# Patient Record
Sex: Male | Born: 1935 | Race: White | Hispanic: No | Marital: Married | Smoking: Never smoker
Health system: Southern US, Community
[De-identification: ages and names within clinical notes are randomized; demographics above are authoritative.]

## PROBLEM LIST (undated history)

## (undated) DIAGNOSIS — C259 Malignant neoplasm of pancreas, unspecified: Secondary | ICD-10-CM

## (undated) DIAGNOSIS — H3530 Unspecified macular degeneration: Secondary | ICD-10-CM

## (undated) DIAGNOSIS — K8689 Other specified diseases of pancreas: Secondary | ICD-10-CM

## (undated) DIAGNOSIS — H409 Unspecified glaucoma: Secondary | ICD-10-CM

## (undated) DIAGNOSIS — E7800 Pure hypercholesterolemia, unspecified: Secondary | ICD-10-CM

## (undated) DIAGNOSIS — I10 Essential (primary) hypertension: Secondary | ICD-10-CM

## (undated) HISTORY — DX: Malignant neoplasm of pancreas, unspecified: C25.9

---

## 1999-03-13 ENCOUNTER — Emergency Department (HOSPITAL_COMMUNITY): Admission: EM | Admit: 1999-03-13 | Discharge: 1999-03-13 | Payer: Self-pay | Admitting: Emergency Medicine

## 2003-12-11 ENCOUNTER — Ambulatory Visit (HOSPITAL_COMMUNITY): Admission: RE | Admit: 2003-12-11 | Discharge: 2003-12-11 | Payer: Self-pay | Admitting: Ophthalmology

## 2006-02-24 ENCOUNTER — Encounter: Payer: Self-pay | Admitting: Emergency Medicine

## 2012-07-06 ENCOUNTER — Other Ambulatory Visit: Payer: Self-pay | Admitting: Family Medicine

## 2012-07-06 DIAGNOSIS — M5104 Intervertebral disc disorders with myelopathy, thoracic region: Secondary | ICD-10-CM

## 2012-07-11 ENCOUNTER — Other Ambulatory Visit: Payer: Self-pay

## 2012-07-12 ENCOUNTER — Ambulatory Visit
Admission: RE | Admit: 2012-07-12 | Discharge: 2012-07-12 | Disposition: A | Discharge status: Home / Self Care | Payer: Medicare Other | Source: Ambulatory Visit | Attending: Family Medicine | Admitting: Family Medicine

## 2012-07-12 VITALS — BP 165/73 | HR 58 | Ht 69.0 in | Wt 162.0 lb

## 2012-07-12 DIAGNOSIS — M5104 Intervertebral disc disorders with myelopathy, thoracic region: Secondary | ICD-10-CM

## 2012-07-12 DIAGNOSIS — M5125 Other intervertebral disc displacement, thoracolumbar region: Secondary | ICD-10-CM

## 2012-07-12 MED ORDER — IOHEXOL 180 MG/ML  SOLN
1.0000 mL | Freq: Once | INTRAMUSCULAR | Status: AC | PRN
  Administered 2012-07-12: 1 mL via EPIDURAL

## 2012-07-12 MED ORDER — METHYLPREDNISOLONE ACETATE 40 MG/ML INJ SUSP (RADIOLOG
120.0000 mg | Freq: Once | INTRAMUSCULAR | Status: AC
  Administered 2012-07-12: 120 mg via EPIDURAL

## 2013-03-09 ENCOUNTER — Ambulatory Visit
Admission: RE | Admit: 2013-03-09 | Discharge: 2013-03-09 | Disposition: A | Discharge status: Home / Self Care | Payer: Medicare Other | Source: Ambulatory Visit | Attending: Rheumatology | Admitting: Rheumatology

## 2013-03-09 ENCOUNTER — Other Ambulatory Visit: Payer: Self-pay | Admitting: Rheumatology

## 2013-03-09 DIAGNOSIS — M542 Cervicalgia: Secondary | ICD-10-CM

## 2013-11-23 ENCOUNTER — Other Ambulatory Visit: Payer: Self-pay | Admitting: Internal Medicine

## 2013-11-23 DIAGNOSIS — R109 Unspecified abdominal pain: Secondary | ICD-10-CM

## 2013-11-24 ENCOUNTER — Encounter (HOSPITAL_COMMUNITY): Payer: Self-pay | Admitting: Emergency Medicine

## 2013-11-24 ENCOUNTER — Inpatient Hospital Stay (HOSPITAL_COMMUNITY)
Admission: EM | Admit: 2013-11-24 | Discharge: 2013-11-30 | DRG: 374 | Disposition: A | Discharge status: Home / Self Care | Payer: Medicare HMO | Attending: Internal Medicine | Admitting: Internal Medicine

## 2013-11-24 ENCOUNTER — Inpatient Hospital Stay (HOSPITAL_COMMUNITY): Payer: Medicare HMO

## 2013-11-24 DIAGNOSIS — C169 Malignant neoplasm of stomach, unspecified: Secondary | ICD-10-CM

## 2013-11-24 DIAGNOSIS — I10 Essential (primary) hypertension: Secondary | ICD-10-CM | POA: Diagnosis present

## 2013-11-24 DIAGNOSIS — K869 Disease of pancreas, unspecified: Secondary | ICD-10-CM | POA: Diagnosis present

## 2013-11-24 DIAGNOSIS — E785 Hyperlipidemia, unspecified: Secondary | ICD-10-CM | POA: Diagnosis present

## 2013-11-24 DIAGNOSIS — R7309 Other abnormal glucose: Secondary | ICD-10-CM | POA: Diagnosis present

## 2013-11-24 DIAGNOSIS — R339 Retention of urine, unspecified: Secondary | ICD-10-CM | POA: Diagnosis present

## 2013-11-24 DIAGNOSIS — H3530 Unspecified macular degeneration: Secondary | ICD-10-CM | POA: Diagnosis present

## 2013-11-24 DIAGNOSIS — K831 Obstruction of bile duct: Secondary | ICD-10-CM | POA: Diagnosis present

## 2013-11-24 DIAGNOSIS — R17 Unspecified jaundice: Secondary | ICD-10-CM

## 2013-11-24 DIAGNOSIS — K8689 Other specified diseases of pancreas: Secondary | ICD-10-CM | POA: Diagnosis present

## 2013-11-24 DIAGNOSIS — D649 Anemia, unspecified: Secondary | ICD-10-CM | POA: Diagnosis present

## 2013-11-24 DIAGNOSIS — E871 Hypo-osmolality and hyponatremia: Secondary | ICD-10-CM | POA: Diagnosis present

## 2013-11-24 DIAGNOSIS — H409 Unspecified glaucoma: Secondary | ICD-10-CM | POA: Diagnosis present

## 2013-11-24 DIAGNOSIS — K3189 Other diseases of stomach and duodenum: Secondary | ICD-10-CM

## 2013-11-24 DIAGNOSIS — E876 Hypokalemia: Secondary | ICD-10-CM | POA: Diagnosis not present

## 2013-11-24 DIAGNOSIS — E7800 Pure hypercholesterolemia, unspecified: Secondary | ICD-10-CM | POA: Diagnosis present

## 2013-11-24 DIAGNOSIS — L299 Pruritus, unspecified: Secondary | ICD-10-CM | POA: Diagnosis present

## 2013-11-24 DIAGNOSIS — I4891 Unspecified atrial fibrillation: Secondary | ICD-10-CM | POA: Diagnosis present

## 2013-11-24 HISTORY — DX: Pure hypercholesterolemia, unspecified: E78.00

## 2013-11-24 HISTORY — DX: Unspecified macular degeneration: H35.30

## 2013-11-24 HISTORY — DX: Unspecified glaucoma: H40.9

## 2013-11-24 HISTORY — DX: Essential (primary) hypertension: I10

## 2013-11-24 HISTORY — DX: Other specified diseases of pancreas: K86.89

## 2013-11-24 LAB — CBC WITH DIFFERENTIAL/PLATELET
BASOS PCT: 0 % (ref 0–1)
Basophils Absolute: 0 10*3/uL (ref 0.0–0.1)
Eosinophils Absolute: 0.1 10*3/uL (ref 0.0–0.7)
Eosinophils Relative: 2 % (ref 0–5)
HEMATOCRIT: 34 % — AB (ref 39.0–52.0)
HEMOGLOBIN: 12.5 g/dL — AB (ref 13.0–17.0)
LYMPHS ABS: 1.6 10*3/uL (ref 0.7–4.0)
Lymphocytes Relative: 19 % (ref 12–46)
MCH: 32.5 pg (ref 26.0–34.0)
MCHC: 36.8 g/dL — AB (ref 30.0–36.0)
MCV: 88.3 fL (ref 78.0–100.0)
MONO ABS: 1.3 10*3/uL — AB (ref 0.1–1.0)
MONOS PCT: 16 % — AB (ref 3–12)
NEUTROS ABS: 5.2 10*3/uL (ref 1.7–7.7)
Neutrophils Relative %: 63 % (ref 43–77)
Platelets: 299 10*3/uL (ref 150–400)
RBC: 3.85 MIL/uL — AB (ref 4.22–5.81)
RDW: 14.9 % (ref 11.5–15.5)
WBC: 8.2 10*3/uL (ref 4.0–10.5)

## 2013-11-24 LAB — COMPREHENSIVE METABOLIC PANEL
ALK PHOS: 858 U/L — AB (ref 39–117)
ALT: 424 U/L — ABNORMAL HIGH (ref 0–53)
AST: 288 U/L — ABNORMAL HIGH (ref 0–37)
Albumin: 3.5 g/dL (ref 3.5–5.2)
BILIRUBIN TOTAL: 11.1 mg/dL — AB (ref 0.3–1.2)
BUN: 18 mg/dL (ref 6–23)
CHLORIDE: 90 meq/L — AB (ref 96–112)
CO2: 23 meq/L (ref 19–32)
CREATININE: 0.64 mg/dL (ref 0.50–1.35)
Calcium: 9.1 mg/dL (ref 8.4–10.5)
GFR, EST NON AFRICAN AMERICAN: 85 mL/min — AB (ref >90)
GLUCOSE: 127 mg/dL — AB (ref 70–99)
Potassium: 4.6 mEq/L (ref 3.7–5.3)
Sodium: 126 mEq/L — ABNORMAL LOW (ref 137–147)
Total Protein: 7.1 g/dL (ref 6.0–8.3)

## 2013-11-24 LAB — LIPASE, BLOOD: LIPASE: 500 U/L — AB (ref 11–59)

## 2013-11-24 LAB — PROTIME-INR
INR: 0.99 (ref 0.00–1.49)
Prothrombin Time: 12.9 seconds (ref 11.6–15.2)

## 2013-11-24 LAB — AMMONIA: AMMONIA: 55 umol/L (ref 11–60)

## 2013-11-24 MED ORDER — IOHEXOL 300 MG/ML  SOLN
100.0000 mL | Freq: Once | INTRAMUSCULAR | Status: AC | PRN
  Administered 2013-11-24: 100 mL via INTRAVENOUS

## 2013-11-24 MED ORDER — SODIUM CHLORIDE 0.9 % IV SOLN
1000.0000 mL | INTRAVENOUS | Status: DC
  Administered 2013-11-24 – 2013-11-26 (×6): 1000 mL via INTRAVENOUS

## 2013-11-24 MED ORDER — SODIUM CHLORIDE 0.9 % IV SOLN
1000.0000 mL | Freq: Once | INTRAVENOUS | Status: AC
  Administered 2013-11-24: 1000 mL via INTRAVENOUS

## 2013-11-24 MED ORDER — ONDANSETRON HCL 4 MG/2ML IJ SOLN: 4.0000 mg | Freq: Three times a day (TID) | INTRAMUSCULAR | Status: AC | PRN

## 2013-11-24 MED ORDER — SODIUM CHLORIDE 0.9 % IV SOLN
INTRAVENOUS | Status: AC
  Administered 2013-11-25: via INTRAVENOUS

## 2013-11-24 MED ORDER — IOHEXOL 300 MG/ML  SOLN
25.0000 mL | INTRAMUSCULAR | Status: AC
  Administered 2013-11-24: 25 mL via ORAL

## 2013-11-24 NOTE — ED Notes (Addendum)
Pt here with referral from Dr. Benson Norway. Reports that they were concerned about his liver and had an MRI/CT done today. States that they were also concerned about his abd, possible mass on the CT. Pt has all of his imaging paperwork with him.

## 2013-11-24 NOTE — ED Provider Notes (Signed)
CSN: [id number]     Arrival date & time 11/24/13  1803 History   First MD Initiated Contact with Patient 11/24/13 1812     Chief Complaint  Patient presents with  . Abnormal Lab    HPI Patient presents with painless jaundice and itching over the past several weeks.  He's been worked up as an outpatient I've primary care physician and was found to have a bilirubin of 10, two days ago.  He had outpatient ultrasound or noncontrasted CT scan of his abdomen and pelvis which demonstrated biliary obstruction.  He denies nausea and vomiting.  No anorexia.  He reports his main complaint is itching.  Patient was recommended to come to the emergency department for possible admission and GI procedure.   Past Medical History  Diagnosis Date  . Hypertension   . Hypercholesteremia    History reviewed. No pertinent past surgical history. History reviewed. No pertinent family history. History  Substance Use Topics  . Smoking status: Never Smoker   . Smokeless tobacco: Never Used  . Alcohol Use: No    Review of Systems  All other systems reviewed and are negative.    Allergies  Review of patient's allergies indicates no known allergies.  Home Medications   Current Outpatient Rx  Name  Route  Sig  Dispense  Refill  . aspirin 81 MG tablet   Oral   Take 81 mg by mouth daily.         . benazepril (LOTENSIN) 10 MG tablet   Oral   Take 10 mg by mouth daily.         . brimonidine (ALPHAGAN P) 0.1 % SOLN   Left Eye   Place 1 drop into the left eye daily.         . calcium citrate-vitamin D (CITRACAL+D) 315-200 MG-UNIT per tablet   Oral   Take 1 tablet by mouth daily.         . Cholecalciferol (VITAMIN D-3) 1000 UNITS CAPS   Oral   Take 1 capsule by mouth daily.         . diazepam (VALIUM) 5 MG tablet   Oral   Take 5 mg by mouth 2 (two) times daily as needed for anxiety. For sleep per daughter         . dorzolamide (TRUSOPT) 2 % ophthalmic solution   Both Eyes   Place  1 drop into both eyes 2 (two) times daily.         . hydrochlorothiazide (HYDRODIURIL) 25 MG tablet   Oral   Take 25 mg by mouth daily.         . Multiple Vitamin (MULTIVITAMIN WITH MINERALS) TABS tablet   Oral   Take 1 tablet by mouth daily.         . Omega-3 Fatty Acids (OMEGA 3 PO)   Oral   Take 1 capsule by mouth at bedtime.         Marland Kitchen OVER THE COUNTER MEDICATION   Oral   Take 1 tablet by mouth 2 (two) times daily. Macular Degeneration supplement         . simvastatin (ZOCOR) 80 MG tablet   Oral   Take 80 mg by mouth daily.          BP 140/70  Pulse 89  Temp(Src) 98 F (36.7 C) (Oral)  Resp 20  Wt 161 lb 12.8 oz (73.392 kg)  SpO2 98% Physical Exam  Nursing note and vitals reviewed. Constitutional: He  is oriented to person, place, and time. He appears well-developed and well-nourished.  Jaundice   HENT:  Head: Normocephalic and atraumatic.  Eyes: EOM are normal.  Neck: Normal range of motion.  Cardiovascular: Normal rate, regular rhythm, normal heart sounds and intact distal pulses.   Pulmonary/Chest: Effort normal and breath sounds normal. No respiratory distress.  Abdominal: Soft. He exhibits no distension. There is no tenderness.  Musculoskeletal: Normal range of motion.  Neurological: He is alert and oriented to person, place, and time.  Skin: Skin is warm and dry.  Psychiatric: He has a normal mood and affect. Judgment normal.    ED Course  Procedures (including critical care time) Labs Review Labs Reviewed  CBC WITH DIFFERENTIAL - Abnormal; Notable for the following:    RBC 3.85 (*)    Hemoglobin 12.5 (*)    HCT 34.0 (*)    MCHC 36.8 (*)    Monocytes Relative 16 (*)    Monocytes Absolute 1.3 (*)    All other components within normal limits  COMPREHENSIVE METABOLIC PANEL - Abnormal; Notable for the following:    Sodium 126 (*)    Chloride 90 (*)    Glucose, Bld 127 (*)    AST 288 (*)    ALT 424 (*)    Alkaline Phosphatase 858 (*)     Total Bilirubin 11.1 (*)    GFR calc non Af Amer 85 (*)    All other components within normal limits  LIPASE, BLOOD - Abnormal; Notable for the following:    Lipase 500 (*)    All other components within normal limits  AMMONIA  PROTIME-INR   Imaging Review No results found.  EKG Interpretation   None       MDM   1. Jaundice    Case was discussed with gastroenterologist Dr. Benson Norway who agrees to admit the patient.  N.p.o. after midnight.  Bilirubin appears to be worsening at 11 today.  Elevated LFTs and lipase as well as alkaline phosphatase consistent with obstructive pathology.  Overall very well appearing. Dr Benson Norway request a contrasted CT scan of his abdomen and pelvis    Hoy Morn, MD 11/24/13 2141

## 2013-11-24 NOTE — ED Notes (Signed)
Pt states he has been itchy. Pt family states that pt's liver levels are high and MRI showed something in the stomach. Pt had ultrasound and MRI (discs are with patient). Pt denies pain. Pt has slight yellowish tinge of skin.

## 2013-11-25 ENCOUNTER — Encounter (HOSPITAL_COMMUNITY): Payer: Self-pay

## 2013-11-25 LAB — COMPREHENSIVE METABOLIC PANEL
ALBUMIN: 3 g/dL — AB (ref 3.5–5.2)
ALK PHOS: 787 U/L — AB (ref 39–117)
ALT: 367 U/L — ABNORMAL HIGH (ref 0–53)
AST: 235 U/L — AB (ref 0–37)
BUN: 17 mg/dL (ref 6–23)
CO2: 19 mEq/L (ref 19–32)
Calcium: 8.6 mg/dL (ref 8.4–10.5)
Chloride: 94 mEq/L — ABNORMAL LOW (ref 96–112)
Creatinine, Ser: 0.55 mg/dL (ref 0.50–1.35)
GFR calc Af Amer: >90 mL/min (ref >90)
GFR calc non Af Amer: >90 mL/min (ref >90)
Glucose, Bld: 138 mg/dL — ABNORMAL HIGH (ref 70–99)
Potassium: 3.9 mEq/L (ref 3.7–5.3)
Sodium: 128 mEq/L — ABNORMAL LOW (ref 137–147)
Total Bilirubin: 11.9 mg/dL — ABNORMAL HIGH (ref 0.3–1.2)
Total Protein: 6.5 g/dL (ref 6.0–8.3)

## 2013-11-25 LAB — CBC
HCT: 31.4 % — ABNORMAL LOW (ref 39.0–52.0)
HEMOGLOBIN: 11.6 g/dL — AB (ref 13.0–17.0)
MCH: 32.5 pg (ref 26.0–34.0)
MCHC: 36.9 g/dL — ABNORMAL HIGH (ref 30.0–36.0)
MCV: 88 fL (ref 78.0–100.0)
Platelets: 269 10*3/uL (ref 150–400)
RBC: 3.57 MIL/uL — ABNORMAL LOW (ref 4.22–5.81)
RDW: 15.1 % (ref 11.5–15.5)
WBC: 8.3 10*3/uL (ref 4.0–10.5)

## 2013-11-25 MED ORDER — CIPROFLOXACIN IN D5W 400 MG/200ML IV SOLN
400.0000 mg | Freq: Two times a day (BID) | INTRAVENOUS | Status: DC
  Administered 2013-11-25 – 2013-11-27 (×5): 400 mg via INTRAVENOUS
  Filled 2013-11-25 (×6): qty 200

## 2013-11-25 NOTE — H&P (Signed)
H&P for [facility]  Reason for Consult: Obstructive jaundice, Pancreatic head mass, Gastric fundus mass, Pruritis Referring Physician: Crist Infante, M.D.  Eliezer Bottom HPI: This is an 80 year old male admitted for obstructive jaundice and pruritis.  His symptoms started 2 weeks ago acutely and since that time his pruritis progressively worsened.  At times his itching can be so severe that he wants to end his life.  No reports of abdominal pain, nausea, vomiting, diarrhea, hematochezia, or melena.  His work up as an outpatient revealed a marked elevation in his TB at 84.  A CT scan was performed, but it was without contrast.  IV access was not able to be obtained during the outpatient scan, however, a mass in the gastric fundus was identified.  Intrahepatic and extrahepatic biliary ductal dilation was also identified.  The admission CT scan revealed a 12 cm x 13 cm lesion in the head of the pancreas that results in an abrupt cut off sign in the CBD.  The fundic lesion measured 4 cm.  Past Medical History  Diagnosis Date  . Hypertension   . Hypercholesteremia     History reviewed. No pertinent past surgical history.  History reviewed. No pertinent family history.  Social History:  reports that he has never smoked. He has never used smokeless tobacco. He reports that he drinks about 4.2 ounces of alcohol per week. He reports that he does not use illicit drugs.  Allergies: No Known Allergies  Medications:  Scheduled: . sodium chloride   Intravenous STAT  . ciprofloxacin  400 mg Intravenous Q12H   Continuous: . sodium chloride 1,000 mL (11/25/13 0823)    Results for orders placed during the hospital encounter of 11/24/13 (from the past 24 hour(s))  CBC WITH DIFFERENTIAL     Status: Abnormal   Collection Time    11/24/13  7:41 PM      Result Value Range   WBC 8.2  4.0 - 10.5 K/uL   RBC 3.85 (*) 4.22 - 5.81 MIL/uL   Hemoglobin 12.5 (*) 13.0 - 17.0 g/dL   HCT 34.0 (*) 39.0 - 52.0 %    MCV 88.3  78.0 - 100.0 fL   MCH 32.5  26.0 - 34.0 pg   MCHC 36.8 (*) 30.0 - 36.0 g/dL   RDW 14.9  11.5 - 15.5 %   Platelets 299  150 - 400 K/uL   Neutrophils Relative % 63  43 - 77 %   Neutro Abs 5.2  1.7 - 7.7 K/uL   Lymphocytes Relative 19  12 - 46 %   Lymphs Abs 1.6  0.7 - 4.0 K/uL   Monocytes Relative 16 (*) 3 - 12 %   Monocytes Absolute 1.3 (*) 0.1 - 1.0 K/uL   Eosinophils Relative 2  0 - 5 %   Eosinophils Absolute 0.1  0.0 - 0.7 K/uL   Basophils Relative 0  0 - 1 %   Basophils Absolute 0.0  0.0 - 0.1 K/uL  COMPREHENSIVE METABOLIC PANEL     Status: Abnormal   Collection Time    11/24/13  7:41 PM      Result Value Range   Sodium 126 (*) 137 - 147 mEq/L   Potassium 4.6  3.7 - 5.3 mEq/L   Chloride 90 (*) 96 - 112 mEq/L   CO2 23  19 - 32 mEq/L   Glucose, Bld 127 (*) 70 - 99 mg/dL   BUN 18  6 - 23 mg/dL   Creatinine,  Ser 0.64  0.50 - 1.35 mg/dL   Calcium 9.1  8.4 - 10.5 mg/dL   Total Protein 7.1  6.0 - 8.3 g/dL   Albumin 3.5  3.5 - 5.2 g/dL   AST 288 (*) 0 - 37 U/L   ALT 424 (*) 0 - 53 U/L   Alkaline Phosphatase 858 (*) 39 - 117 U/L   Total Bilirubin 11.1 (*) 0.3 - 1.2 mg/dL   GFR calc non Af Amer 85 (*) >90 mL/min   GFR calc Af Amer >90  >90 mL/min  LIPASE, BLOOD     Status: Abnormal   Collection Time    11/24/13  7:41 PM      Result Value Range   Lipase 500 (*) 11 - 59 U/L  PROTIME-INR     Status: None   Collection Time    11/24/13  7:41 PM      Result Value Range   Prothrombin Time 12.9  11.6 - 15.2 seconds   INR 0.99  0.00 - 1.49  AMMONIA     Status: None   Collection Time    11/24/13  8:30 PM      Result Value Range   Ammonia 55  11 - 60 umol/L     Ct Abdomen Pelvis W Contrast  11/24/2013   CLINICAL DATA:  Elevated bilirubin and lipase.  Jaundice  EXAM: CT ABDOMEN AND PELVIS WITH CONTRAST  TECHNIQUE: Multidetector CT imaging of the abdomen and pelvis was performed using the standard protocol following bolus administration of intravenous contrast.   CONTRAST:  129mL OMNIPAQUE IOHEXOL 300 MG/ML  SOLN  COMPARISON:  None.  FINDINGS: Lung bases are clear.  No pericardial fluid.  There is intrahepatic and extrahepatic biliary duct dilatation. The common bile duct measures 16 mm up to the pancreatic head. There is a abrupt transition at the pancreatic head. There is mild dilatation of the pancreatic duct to 5 mm. There is a potential relative hypo attenuating lesion within the head of the pancreas measuring 13 by 12 mm (image 40, series 2). The gallbladder is distended up to 4.9 cm. No inflammation evident. No focal hepatic lesion. The pancreas itself is atrophic.  The spleen, adrenal glands, kidneys demonstrate no acute findings. There parapelvic cysts in the kidneys.  The stomach contains a rounded masslike lesion in the fundus measuring 4.0 x 3.2 cm (image 26, series 2). This appears to emanate from the gastric mucosa wiht central calcifications. The more distal stomach is normal. The small bowel colon are unremarkable.  Abdominal aorta is normal caliber. No retroperitoneal periportal lymphadenopathy.  No free fluid the pelvis. The prostate gland is enlarged. Bladder is normal. No pelvic lymphadenopathy. Insert negative then  IMPRESSION: 1. Intrahepatic and extrahepatic biliary duct dilatation with marked dilatation of the common bile duct up to the pancreatic head. Potential mass lesion within the pancreatic head. Recommend a ERCP/EUS for relief of the biliary obstruction and sampling of the potential pancreatic head lesion. 2. Mild dilatation of the pancreatic duct and associated pancreatic atrophy would also be consistent with a pancreatic head mass lesion. Recommend correlation with tumor marker CA 19 9. 3. There is a is potential polypoid 3 cm lesion extending from the gastric mucosa of the stomach fundus. This could represent a primary gastric neoplasm. Recommend optical evaluation at the same time as the above recommended ERCP/EUS. 4. Prostate hypertrophy.    Electronically Signed   By: Suzy Bouchard M.D.   On: 11/24/2013 23:52    ROS:  As stated above in the HPI otherwise negative.  Blood pressure 117/56, pulse 66, temperature 97.5 F (36.4 C), temperature source Oral, resp. rate 18, height 5\' 8"  (1.727 m), weight 161 lb 12.8 oz (73.392 kg), SpO2 100.00%.    PE: Gen: NAD, Alert and Oriented, Jaundiced HEENT:  [place]/AT, EOMI Neck: Supple, no LAD Lungs: CTA Bilaterally CV: RRR without M/G/R ABM: Soft, NTND, +BS Ext: No C/C/E  Assessment/Plan: 1) Pancreatic head mass. 2) Obstructive jaundice. 3) Pruritis. 4) Gastric mass. 5) Hyponatremia.   His pruritis at this time is tolerable, but it does become intolerable at times.  No evidence of cholangitis.  I will pursue further treatment with an ERCP to decompress his bile ducts.  Subsequently he will require an EUS with Dr. Ardis Hughs for pathologic diagnosis.  Plan: 1) ERCP tomorrow. 2) Start Cipro to prophylax against an ascending cholangitis. 3) Check Ca19-9.  Derric Dealmeida D 11/25/2013, 9:10 AM

## 2013-11-25 NOTE — Progress Notes (Signed)
Patient stated he had not voided since the afternoon of 11/24/13. Bladder scan revealed 257ml. Dr. Benson Norway on floor, notified of bladder scan results.

## 2013-11-26 ENCOUNTER — Encounter (HOSPITAL_COMMUNITY): Payer: Self-pay

## 2013-11-26 ENCOUNTER — Encounter (HOSPITAL_COMMUNITY)
Admission: EM | Disposition: A | Discharge status: Home / Self Care | Payer: Self-pay | Source: Home / Self Care | Attending: Gastroenterology

## 2013-11-26 ENCOUNTER — Inpatient Hospital Stay (HOSPITAL_COMMUNITY): Payer: Medicare HMO

## 2013-11-26 HISTORY — PX: ERCP: SHX5425

## 2013-11-26 LAB — CANCER ANTIGEN 19-9: CA 19 9: 5 U/mL — AB (ref <35.0)

## 2013-11-26 SURGERY — ERCP, WITH INTERVENTION IF INDICATED
Anesthesia: Moderate Sedation

## 2013-11-26 MED ORDER — SODIUM CHLORIDE 0.9 % IV SOLN: INTRAVENOUS | Status: DC

## 2013-11-26 MED ORDER — ATORVASTATIN CALCIUM 40 MG PO TABS
40.0000 mg | ORAL_TABLET | Freq: Every day | ORAL | Status: DC
  Administered 2013-11-26: 40 mg via ORAL
  Filled 2013-11-26 (×2): qty 1

## 2013-11-26 MED ORDER — ASPIRIN 81 MG PO CHEW
81.0000 mg | CHEWABLE_TABLET | Freq: Every day | ORAL | Status: DC
  Administered 2013-11-26 – 2013-11-29 (×4): 81 mg via ORAL
  Filled 2013-11-26 (×5): qty 1

## 2013-11-26 MED ORDER — FENTANYL CITRATE 0.05 MG/ML IJ SOLN
INTRAMUSCULAR | Status: DC | PRN
  Administered 2013-11-26 (×4): 25 ug via INTRAVENOUS

## 2013-11-26 MED ORDER — IOHEXOL 350 MG/ML SOLN
INTRAVENOUS | Status: DC | PRN
  Administered 2013-11-26: 10:00:00

## 2013-11-26 MED ORDER — HYDROCHLOROTHIAZIDE 25 MG PO TABS
25.0000 mg | ORAL_TABLET | Freq: Every day | ORAL | Status: DC
  Administered 2013-11-27: 25 mg via ORAL
  Filled 2013-11-26 (×2): qty 1

## 2013-11-26 MED ORDER — GLUCAGON HCL (RDNA) 1 MG IJ SOLR
INTRAMUSCULAR | Status: AC
  Filled 2013-11-26: qty 3

## 2013-11-26 MED ORDER — CALCIUM CARBONATE-VITAMIN D 500-200 MG-UNIT PO TABS
1.0000 | ORAL_TABLET | Freq: Every day | ORAL | Status: DC
  Administered 2013-11-26 – 2013-11-30 (×5): 1 via ORAL
  Filled 2013-11-26 (×5): qty 1

## 2013-11-26 MED ORDER — MIDAZOLAM HCL 5 MG/ML IJ SOLN
INTRAMUSCULAR | Status: AC
  Filled 2013-11-26: qty 4

## 2013-11-26 MED ORDER — DIAZEPAM 5 MG PO TABS: 5.0000 mg | ORAL_TABLET | Freq: Two times a day (BID) | ORAL | Status: DC | PRN

## 2013-11-26 MED ORDER — FENTANYL CITRATE 0.05 MG/ML IJ SOLN
INTRAMUSCULAR | Status: AC
  Filled 2013-11-26: qty 4

## 2013-11-26 MED ORDER — DORZOLAMIDE HCL 2 % OP SOLN
1.0000 [drp] | Freq: Two times a day (BID) | OPHTHALMIC | Status: DC
  Administered 2013-11-26 – 2013-11-30 (×9): 1 [drp] via OPHTHALMIC
  Filled 2013-11-26: qty 10

## 2013-11-26 MED ORDER — MIDAZOLAM HCL 10 MG/2ML IJ SOLN
INTRAMUSCULAR | Status: DC | PRN
  Administered 2013-11-26 (×3): 2 mg via INTRAVENOUS

## 2013-11-26 MED ORDER — VITAMIN D 1000 UNITS PO TABS
1000.0000 [IU] | ORAL_TABLET | Freq: Every day | ORAL | Status: DC
  Administered 2013-11-26 – 2013-11-30 (×5): 1000 [IU] via ORAL
  Filled 2013-11-26 (×5): qty 1

## 2013-11-26 MED ORDER — ADULT MULTIVITAMIN W/MINERALS CH
1.0000 | ORAL_TABLET | Freq: Every day | ORAL | Status: DC
  Administered 2013-11-26 – 2013-11-30 (×5): 1 via ORAL
  Filled 2013-11-26 (×5): qty 1

## 2013-11-26 MED ORDER — GLUCAGON HCL (RDNA) 1 MG IJ SOLR
INTRAMUSCULAR | Status: DC | PRN
  Administered 2013-11-26: .5 mg via INTRAVENOUS

## 2013-11-26 MED ORDER — BENAZEPRIL HCL 10 MG PO TABS
10.0000 mg | ORAL_TABLET | Freq: Every day | ORAL | Status: DC
  Administered 2013-11-27 – 2013-11-30 (×4): 10 mg via ORAL
  Filled 2013-11-26 (×4): qty 1

## 2013-11-26 MED ORDER — BUTAMBEN-TETRACAINE-BENZOCAINE 2-2-14 % EX AERO
INHALATION_SPRAY | CUTANEOUS | Status: DC | PRN
  Administered 2013-11-26: 2 via TOPICAL

## 2013-11-26 MED ORDER — BRIMONIDINE TARTRATE 0.2 % OP SOLN
1.0000 [drp] | Freq: Three times a day (TID) | OPHTHALMIC | Status: DC
  Administered 2013-11-26 – 2013-11-27 (×3): 1 [drp] via OPHTHALMIC
  Filled 2013-11-26: qty 5

## 2013-11-26 NOTE — Op Note (Signed)
[facility], [id number]   ERCP PROCEDURE REPORT  PATIENT: Glenn Patterson, Glenn T.  MR# :[id number] BIRTHDATE: July 09, 1923  GENDER: Male ENDOSCOPIST: Carol Ada, MD REFERRED BY: PROCEDURE DATE:  11/26/2013 PROCEDURE:   ERCP with stent placement ASA CLASS:   Class III INDICATIONS:Biliary obstruction. MEDICATIONS: Fentanyl 100 mcg IV, Versed 7 mg IV, and Glucagon 0.5 mg IV TOPICAL ANESTHETIC: none  DESCRIPTION OF PROCEDURE:   After the risks benefits and alternatives of the procedure were thoroughly explained, informed consent was obtained.  The Pentax Ercp Scope [id number]  endoscope was introduced through the mouth  and advanced to the second portion of the duodenum . The ampulla was located in second portion of the duodenum.  During the cannulation attempt the PD was cannulated twice.  After several more attempts the CBD was able to be cannulated.  The guidewire was secured in the right intrahepatic ducts.  Contrast injection revealed a dilation of the CBD at 1 cm and there was a cut off sign in the distal CBD.  Some resistance was met with passage of the sphinctertome through the strictured region.  The strictured region measured 5 cm in length. An 8 mm sphincterotomy was created.  Because of the resistance encountered with the sphincterotome, a 7 Fr x 5 cm stent was inserted.  The sent was placed without any difficulty.  Excellent drainage was achieved.  Attention was not directed towards the fundic mass. There was friable hard mass in the fundus and multiple cold biopsies were obtained.  The scope was then completely withdrawn from the patient and the procedure terminated.     COMPLICATIONS:  ENDOSCOPIC IMPRESSION: 1) CBD obstruction secondary to pancreatic head lesion extrinsic compression. 2) Fundic mass.  RECOMMENDATIONS: 1) Await biopsy results. 2) Follow liver enzymes. 3) EUS per [facility].    _______________________________ eSignedCarol Ada, MD 11/26/2013 9:30 AM   CC:

## 2013-11-26 NOTE — Interval H&P Note (Signed)
History and Physical Interval Note:  11/26/2013 8:26 AM  Glenn Patterson  has presented today for surgery, with the diagnosis of Obstructive jaundice  The various methods of treatment have been discussed with the patient and family. After consideration of risks, benefits and other options for treatment, the patient has consented to  Procedure(s): ENDOSCOPIC RETROGRADE CHOLANGIOPANCREATOGRAPHY (ERCP) (N/A) as a surgical intervention .  The patient's history has been reviewed, patient examined, no change in status, stable for surgery.  I have reviewed the patient's chart and labs.  Questions were answered to the patient's satisfaction.     [name] D

## 2013-11-27 ENCOUNTER — Encounter (HOSPITAL_COMMUNITY): Payer: Self-pay | Admitting: Gastroenterology

## 2013-11-27 DIAGNOSIS — R339 Retention of urine, unspecified: Secondary | ICD-10-CM | POA: Diagnosis present

## 2013-11-27 DIAGNOSIS — L299 Pruritus, unspecified: Secondary | ICD-10-CM | POA: Diagnosis present

## 2013-11-27 DIAGNOSIS — E876 Hypokalemia: Secondary | ICD-10-CM | POA: Diagnosis present

## 2013-11-27 DIAGNOSIS — E871 Hypo-osmolality and hyponatremia: Secondary | ICD-10-CM

## 2013-11-27 DIAGNOSIS — K869 Disease of pancreas, unspecified: Secondary | ICD-10-CM

## 2013-11-27 DIAGNOSIS — R17 Unspecified jaundice: Secondary | ICD-10-CM

## 2013-11-27 DIAGNOSIS — H409 Unspecified glaucoma: Secondary | ICD-10-CM

## 2013-11-27 DIAGNOSIS — H3530 Unspecified macular degeneration: Secondary | ICD-10-CM

## 2013-11-27 DIAGNOSIS — I10 Essential (primary) hypertension: Secondary | ICD-10-CM | POA: Diagnosis present

## 2013-11-27 DIAGNOSIS — K8689 Other specified diseases of pancreas: Secondary | ICD-10-CM | POA: Diagnosis present

## 2013-11-27 DIAGNOSIS — E785 Hyperlipidemia, unspecified: Secondary | ICD-10-CM | POA: Diagnosis present

## 2013-11-27 LAB — COMPREHENSIVE METABOLIC PANEL
ALT: 277 U/L — ABNORMAL HIGH (ref 0–53)
ALT: 270 U/L — AB (ref 0–53)
AST: 173 U/L — AB (ref 0–37)
AST: 162 U/L — ABNORMAL HIGH (ref 0–37)
Albumin: 2.4 g/dL — ABNORMAL LOW (ref 3.5–5.2)
Albumin: 2.6 g/dL — ABNORMAL LOW (ref 3.5–5.2)
Alkaline Phosphatase: 676 U/L — ABNORMAL HIGH (ref 39–117)
Alkaline Phosphatase: 759 U/L — ABNORMAL HIGH (ref 39–117)
BILIRUBIN TOTAL: 8.8 mg/dL — AB (ref 0.3–1.2)
BILIRUBIN TOTAL: 7.8 mg/dL — AB (ref 0.3–1.2)
BUN: 11 mg/dL (ref 6–23)
BUN: 9 mg/dL (ref 6–23)
CHLORIDE: 99 meq/L (ref 96–112)
CHLORIDE: 97 meq/L (ref 96–112)
CO2: 21 mEq/L (ref 19–32)
CO2: 21 meq/L (ref 19–32)
Calcium: 8.1 mg/dL — ABNORMAL LOW (ref 8.4–10.5)
Calcium: 8.4 mg/dL (ref 8.4–10.5)
Creatinine, Ser: 0.55 mg/dL (ref 0.50–1.35)
Creatinine, Ser: 0.54 mg/dL (ref 0.50–1.35)
GFR calc Af Amer: >90 mL/min (ref >90)
GFR calc non Af Amer: >90 mL/min (ref >90)
GFR calc non Af Amer: >90 mL/min (ref >90)
GLUCOSE: 118 mg/dL — AB (ref 70–99)
Glucose, Bld: 107 mg/dL — ABNORMAL HIGH (ref 70–99)
POTASSIUM: 3.6 meq/L — AB (ref 3.7–5.3)
POTASSIUM: 3.6 meq/L — AB (ref 3.7–5.3)
Sodium: 133 mEq/L — ABNORMAL LOW (ref 137–147)
Sodium: 131 mEq/L — ABNORMAL LOW (ref 137–147)
TOTAL PROTEIN: 5.3 g/dL — AB (ref 6.0–8.3)
TOTAL PROTEIN: 5.9 g/dL — AB (ref 6.0–8.3)

## 2013-11-27 LAB — FERRITIN: Ferritin: 437 ng/mL — ABNORMAL HIGH (ref 22–322)

## 2013-11-27 LAB — IRON AND TIBC
Iron: 116 ug/dL (ref 42–135)
SATURATION RATIOS: 45 % (ref 20–55)
TIBC: 260 ug/dL (ref 215–435)
UIBC: 144 ug/dL (ref 125–400)

## 2013-11-27 LAB — CBC
HCT: 26.9 % — ABNORMAL LOW (ref 39.0–52.0)
Hemoglobin: 10 g/dL — ABNORMAL LOW (ref 13.0–17.0)
MCH: 32.6 pg (ref 26.0–34.0)
MCHC: 37.2 g/dL — AB (ref 30.0–36.0)
MCV: 87.6 fL (ref 78.0–100.0)
PLATELETS: 252 10*3/uL (ref 150–400)
RBC: 3.07 MIL/uL — ABNORMAL LOW (ref 4.22–5.81)
RDW: 15.6 % — ABNORMAL HIGH (ref 11.5–15.5)
WBC: 7 10*3/uL (ref 4.0–10.5)

## 2013-11-27 LAB — RETICULOCYTES
RBC.: 3.44 MIL/uL — ABNORMAL LOW (ref 4.22–5.81)
RETIC COUNT ABSOLUTE: 41.3 10*3/uL (ref 19.0–186.0)
RETIC CT PCT: 1.2 % (ref 0.4–3.1)

## 2013-11-27 LAB — TSH: TSH: 0.422 u[IU]/mL (ref 0.350–4.500)

## 2013-11-27 LAB — HEMOGLOBIN A1C
Hgb A1c MFr Bld: 5.7 % — ABNORMAL HIGH (ref <5.7)
Mean Plasma Glucose: 117 mg/dL — ABNORMAL HIGH (ref <117)

## 2013-11-27 LAB — FOLATE: Folate: >20 ng/mL

## 2013-11-27 LAB — VITAMIN B12: Vitamin B-12: 1479 pg/mL — ABNORMAL HIGH (ref 211–911)

## 2013-11-27 LAB — MAGNESIUM: Magnesium: 1.8 mg/dL (ref 1.5–2.5)

## 2013-11-27 MED ORDER — HYDROCHLOROTHIAZIDE 12.5 MG PO CAPS
12.5000 mg | ORAL_CAPSULE | Freq: Every day | ORAL | Status: DC
  Administered 2013-11-28 – 2013-11-30 (×3): 12.5 mg via ORAL
  Filled 2013-11-27 (×3): qty 1

## 2013-11-27 MED ORDER — SODIUM CHLORIDE 0.9 % IV SOLN: 1000.0000 mL | INTRAVENOUS | Status: DC

## 2013-11-27 MED ORDER — POTASSIUM CHLORIDE CRYS ER 20 MEQ PO TBCR
40.0000 meq | EXTENDED_RELEASE_TABLET | Freq: Once | ORAL | Status: AC
  Administered 2013-11-27: 40 meq via ORAL
  Filled 2013-11-27: qty 2

## 2013-11-27 MED ORDER — MORPHINE SULFATE 2 MG/ML IJ SOLN
2.0000 mg | Freq: Once | INTRAMUSCULAR | Status: AC
  Administered 2013-11-27: 2 mg via INTRAVENOUS
  Filled 2013-11-27: qty 1

## 2013-11-27 MED ORDER — BRIMONIDINE TARTRATE 0.2 % OP SOLN
1.0000 [drp] | Freq: Two times a day (BID) | OPHTHALMIC | Status: DC
  Administered 2013-11-27 – 2013-11-30 (×6): 1 [drp] via OPHTHALMIC

## 2013-11-27 MED ORDER — HEPARIN SODIUM (PORCINE) 5000 UNIT/ML IJ SOLN
5000.0000 [IU] | Freq: Three times a day (TID) | INTRAMUSCULAR | Status: DC
  Administered 2013-11-27 – 2013-11-29 (×8): 5000 [IU] via SUBCUTANEOUS
  Filled 2013-11-27 (×11): qty 1

## 2013-11-27 MED ORDER — TAMSULOSIN HCL 0.4 MG PO CAPS
0.4000 mg | ORAL_CAPSULE | Freq: Every day | ORAL | Status: DC
  Administered 2013-11-27 – 2013-11-29 (×3): 0.4 mg via ORAL
  Filled 2013-11-27 (×4): qty 1

## 2013-11-27 NOTE — Progress Notes (Signed)
Patient seen, examined, and I agree with the above documentation, including the assessment and plan. Bx from stomach pending Dr. Ardis Hughs planning EUS for panc lesion next Thursday as an outpatient.  We will arrange this and communicate with pt LFTs downtrending s/p stent.  Continue to follow Pruritis much improved, good appetite Some urinary retention and hasn't voided yet after foley removed.   Maybe home tomorrow if able to void, okay from GI standpt after LFT check

## 2013-11-27 NOTE — Progress Notes (Addendum)
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  - path pending. Mass in gastric fundus biopsied. [place] 19-9 is 5.0  - s/p 11/26/13 ERCP with placement 7 Fr/5cm stent (plastic per call to endo)  - elevated LFTs/jaundice slightly improved.  - Pruritus improved.  - Start DVT prophylaxis as pt is at high risk for VTE  Normocytic anemia - monitor CBC, check anemia panel   Urinary retention- Had Foley place, removed today for voiding trial, replace if needed.  Trial of flomax.   Hyponatremia - concerning for dehydration, vs diuretic therapy vs SIADH given presumed malignancy, following BMP - will decrease dose of home HCTZ to 12.5 mg daily.   Hyperglycemia -  Mild elevation of BS noted, check A1c, following  Mild hypokalemia - replete, follow BMP, check magnesium level.    Glaucoma - resume home alphagan/trusopt  Hypertension - stable on home meds  Dyslipidemia - hold lipitor for now until LFTs improve more. Check fasting lipid panel.    HPI/Subjective: Pt still has pruritus but has improved.   Objective: Filed Vitals:   11/27/13 0521  BP: 131/59  Pulse: 69  Temp: 98 F (36.7 C)  Resp: 20    Intake/Output Summary (Last 24 hours) at 11/27/13 1506 Last data filed at 11/27/13 0659  Gross per 24 hour  Intake 2193.75 ml  Output    450 ml  Net 1743.75 ml   Filed Weights   11/24/13 1809 11/25/13 0800  Weight: 161 lb 12.8 oz (73.392 kg) 161 lb 12.8 oz (73.392 kg)   Exam:  Gen: NAD, Alert and Oriented, significantly Jaundiced  HEENT: [place]/AT, EOMI, bilateral scleral icterus Neck: Supple, no LAD  Lungs: CTA Bilaterally  CV: RRR without M/G/R  ABM: Soft, NTND, +BS  Ext: No C/C/E  Data Reviewed: Basic Metabolic Panel:  Recent Labs Lab 11/24/13 1941 11/25/13 1115 11/27/13 0110 11/27/13 1235  NA 126* 128*  133* 131*  K 4.6 3.9 3.6* 3.6*  CL 90* 94* 99 97  CO2 23 19 21 21   GLUCOSE 127* 138* 107* 118*  BUN 18 17 11 9   CREATININE 0.64 0.55 0.55 0.54  CALCIUM 9.1 8.6 8.1* 8.4   Liver Function Tests:  Recent Labs Lab 11/24/13 1941 11/25/13 1115 11/27/13 0110 11/27/13 1235  AST 288* 235* 173* 162*  ALT 424* 367* 277* 270*  ALKPHOS 858* 787* 676* 759*  BILITOT 11.1* 11.9* 8.8* 7.8*  PROT 7.1 6.5 5.3* 5.9*  ALBUMIN 3.5 3.0* 2.4* 2.6*    Recent Labs Lab 11/24/13 1941  LIPASE 500*    Recent Labs Lab 11/24/13 2030  AMMONIA 55   CBC:  Recent Labs Lab 11/24/13 1941 11/25/13 1115 11/27/13 0110  WBC 8.2 8.3 7.0  NEUTROABS 5.2  --   --   HGB 12.5* 11.6* 10.0*  HCT 34.0* 31.4* 26.9*  MCV 88.3 88.0 87.6  PLT 299 269 252   Cardiac Enzymes: No results found for this basename: CKTOTAL, CKMB, CKMBINDEX, TROPONINI,  in the last 168 hours BNP (last 3 results) No results found for this basename: PROBNP,  in the last 8760 hours CBG: No results found for this basename: GLUCAP,  in the last 168 hours  No results found for this or any previous visit (from the past 240 hour(s)).   Studies: Dg Ercp Biliary & Pancreatic Ducts  11/26/2013   CLINICAL DATA:  ERCP  for pancreatic mass.  EXAM: ERCP  TECHNIQUE: Multiple spot images obtained with the fluoroscopic device and submitted for interpretation post-procedure.  COMPARISON:  CT, 11/24/2013.  FINDINGS: Two images from an ERCP show cannulization of the common bile duct with subsequent placement of a biliary stent. The proximal duct appears irregular and narrowed consistent with a stricture. The distal duct is dilated. No duct filling defect is seen on the images submitted.  IMPRESSION: ERCP images as described.  These images were submitted for radiologic interpretation only. Please see the procedural report for the amount of contrast and the fluoroscopy time utilized.   Electronically Signed   By: Lajean Manes M.D.   On: 11/26/2013 10:37     Scheduled Meds: . aspirin  81 mg Oral Daily  . atorvastatin  40 mg Oral q1800  . benazepril  10 mg Oral Daily  . brimonidine  1 drop Both Eyes TID  . calcium-vitamin D  1 tablet Oral Daily  . cholecalciferol  1,000 Units Oral Daily  . dorzolamide  1 drop Both Eyes BID  . heparin subcutaneous  5,000 Units Subcutaneous Q8H  . hydrochlorothiazide  25 mg Oral Daily  . multivitamin with minerals  1 tablet Oral Daily   Continuous Infusions: . sodium chloride 1,000 mL (11/27/13 1225)    Active Problems:   Jaundice  Glenn Patterson [facility] Pager [phone]. If 7PM-7AM, please contact night-coverage at [email or web address], password [facility] 11/27/2013, 3:06 PM  LOS: 3 days

## 2013-11-27 NOTE — Progress Notes (Signed)
Daily Rounding Note  11/27/2013, 10:12 AM  LOS: 3 days   SUBJECTIVE:       On regular diet. Itching improved.  No abdominal pain or anorexia or n/v ever.  Just jaundice and pruritus.  Foley placed due to urinary retention, still in place and pt requesting it's removal. No BMs since arrival, loo  OBJECTIVE:         Vital signs in last 24 hours:    Temp:  [97.4 F (36.3 C)-98.2 F (36.8 C)] 98 F (36.7 C) (02/02 0521) Pulse Rate:  [69-73] 69 (02/02 0521) Resp:  [16-20] 20 (02/02 0521) BP: (114-147)/(48-67) 131/59 mmHg (02/02 0521) SpO2:  [95 %-98 %] 95 % (02/02 0521) Last BM Date: 11/25/13 General: no obvious jaundice.    Heart: RRR Chest: clear Abdomen: soft, no mass, not tender, good BS  Extremities: no CCE Neuro/Psych:  Pleasant, HOH, not confused.   Intake/Output from previous day: 02/01 0701 - 02/02 0700 In: 5297.9 [P.O.:300; I.V.:4997.9] Out: 2025 [Urine:2025]  Intake/Output this shift:    Lab Results:  Recent Labs  11/24/13 1941 11/25/13 1115 11/27/13 0110  WBC 8.2 8.3 7.0  HGB 12.5* 11.6* 10.0*  HCT 34.0* 31.4* 26.9*  PLT 299 269 252  MCV    88  BMET  Recent Labs  11/24/13 1941 11/25/13 1115 11/27/13 0110  NA 126* 128* 133*  K 4.6 3.9 3.6*  CL 90* 94* 99  CO2 23 19 21   GLUCOSE 127* 138* 107*  BUN 18 17 11   CREATININE 0.64 0.55 0.55  CALCIUM 9.1 8.6 8.1*   LFT  Recent Labs  11/24/13 1941 11/25/13 1115 11/27/13 0110  PROT 7.1 6.5 5.3*  ALBUMIN 3.5 3.0* 2.4*  AST 288* 235* 173*  ALT 424* 367* 277*  ALKPHOS 858* 787* 676*  BILITOT 11.1* 11.9* 8.8*   PT/INR  Recent Labs  11/24/13 1941  LABPROT 12.9  INR 0.99    Studies/Results: Dg Ercp Biliary & Pancreatic Ducts 11/26/2013   CLINICAL DATA:  ERCP for pancreatic mass.  EXAM: ERCP  TECHNIQUE: Multiple spot images obtained with the fluoroscopic device and submitted for interpretation post-procedure.  COMPARISON:  CT,  11/24/2013.  FINDINGS: Two images from an ERCP show cannulization of the common bile duct with subsequent placement of a biliary stent. The proximal duct appears irregular and narrowed consistent with a stricture. The distal duct is dilated. No duct filling defect is seen on the images submitted.  IMPRESSION: ERCP images as described.  These images were submitted for radiologic interpretation only. Please see the procedural report for the amount of contrast and the fluoroscopy time utilized.   Electronically Signed   By: Lajean Manes M.D.   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  Presumed malignant, path pending. Mass in gastric fundus biopsied.  [place] 19-9 is 5.0 S/p 11/26/13 ERCP with placement 7 Fr/5cm stent (plastic per call to endo)   LFTs/jaundice improved.  Pruritus improved.  *  Normocytic anemia.  Baseline not known.  *  Urinary retention. Foley place *  Hyponatremia, improved *  Hyperglycemia, no previous issues with blood sugars.  *  Mild hypokalemia.    PLAN   *  Surg pathology pending. CMET in AM.  *  Working on arranging EUS with Dr Ardis Hughs.  Pt prefers it be done as inpt but if delay for a few days, would rather d/c  home in interim.   *  Dr Murvin Natal has agreed to assume care. For mgt of urinary retention, hyponatremia, hypokalemia, hyperglycemia .  *  Remove foley for now *  Stop Cipro.     Glenn Patterson  11/27/2013, 10:12 AM Pager: [phone]

## 2013-11-28 ENCOUNTER — Other Ambulatory Visit: Payer: Self-pay

## 2013-11-28 ENCOUNTER — Telehealth: Payer: Self-pay

## 2013-11-28 DIAGNOSIS — I10 Essential (primary) hypertension: Secondary | ICD-10-CM

## 2013-11-28 DIAGNOSIS — K8689 Other specified diseases of pancreas: Secondary | ICD-10-CM

## 2013-11-28 DIAGNOSIS — R339 Retention of urine, unspecified: Secondary | ICD-10-CM

## 2013-11-28 DIAGNOSIS — K319 Disease of stomach and duodenum, unspecified: Secondary | ICD-10-CM

## 2013-11-28 DIAGNOSIS — K831 Obstruction of bile duct: Secondary | ICD-10-CM

## 2013-11-28 DIAGNOSIS — C169 Malignant neoplasm of stomach, unspecified: Principal | ICD-10-CM

## 2013-11-28 LAB — LIPID PANEL
CHOLESTEROL: 143 mg/dL (ref 0–200)
HDL: 14 mg/dL — AB (ref >39)
LDL Cholesterol: 113 mg/dL — ABNORMAL HIGH (ref 0–99)
Total CHOL/HDL Ratio: 10.2 RATIO
Triglycerides: 78 mg/dL (ref <150)
VLDL: 16 mg/dL (ref 0–40)

## 2013-11-28 LAB — COMPREHENSIVE METABOLIC PANEL
ALT: 212 U/L — ABNORMAL HIGH (ref 0–53)
AST: 107 U/L — ABNORMAL HIGH (ref 0–37)
Albumin: 2.6 g/dL — ABNORMAL LOW (ref 3.5–5.2)
Alkaline Phosphatase: 670 U/L — ABNORMAL HIGH (ref 39–117)
BUN: 9 mg/dL (ref 6–23)
CO2: 23 mEq/L (ref 19–32)
CREATININE: 0.53 mg/dL (ref 0.50–1.35)
Calcium: 8.5 mg/dL (ref 8.4–10.5)
Chloride: 95 mEq/L — ABNORMAL LOW (ref 96–112)
GFR calc Af Amer: >90 mL/min (ref >90)
GFR calc non Af Amer: >90 mL/min (ref >90)
GLUCOSE: 104 mg/dL — AB (ref 70–99)
Potassium: 3.7 mEq/L (ref 3.7–5.3)
Sodium: 131 mEq/L — ABNORMAL LOW (ref 137–147)
Total Bilirubin: 5.6 mg/dL — ABNORMAL HIGH (ref 0.3–1.2)
Total Protein: 5.5 g/dL — ABNORMAL LOW (ref 6.0–8.3)

## 2013-11-28 LAB — CBC
HCT: 26 % — ABNORMAL LOW (ref 39.0–52.0)
HEMOGLOBIN: 9.5 g/dL — AB (ref 13.0–17.0)
MCH: 32.3 pg (ref 26.0–34.0)
MCHC: 36.5 g/dL — AB (ref 30.0–36.0)
MCV: 88.4 fL (ref 78.0–100.0)
Platelets: 262 10*3/uL (ref 150–400)
RBC: 2.94 MIL/uL — ABNORMAL LOW (ref 4.22–5.81)
RDW: 15.9 % — AB (ref 11.5–15.5)
WBC: 7 10*3/uL (ref 4.0–10.5)

## 2013-11-28 LAB — LIPASE, BLOOD: LIPASE: 169 U/L — AB (ref 11–59)

## 2013-11-28 NOTE — Progress Notes (Signed)
          Daily Rounding Note  11/28/2013, 9:24 AM  LOS: 4 days   SUBJECTIVE:       Feels well, itching is much better, urine less dark.  Still has foley catheter in place.   OBJECTIVE:         Vital signs in last 24 hours:    Temp:  [97.3 F (36.3 C)-97.8 F (36.6 C)] 97.4 F (36.3 C) (02/03 0550) Pulse Rate:  [68-72] 70 (02/03 0550) Resp:  [18-20] 18 (02/03 0550) BP: (103-152)/(49-53) 127/49 mmHg (02/03 0550) SpO2:  [94 %-98 %] 96 % (02/03 0550) Last BM Date: 11/25/13 General: looks tired but well   Heart: irreularly irregular, rate controlled Chest: clear bil.  No cough, no labored breathing Abdomen: soft, NT, active BS, ND  Extremities: no CCE Neuro/Psych:  Pleasant, not confused.   Intake/Output from previous day: 02/02 0701 - 02/03 0700 In: 2161.7 [P.O.:1030; I.V.:331.7; IV Piggyback:800] Out: 1900 [Urine:1900]  Intake/Output this shift:    Lab Results:  Recent Labs  11/25/13 1115 11/27/13 0110 11/28/13 0715  WBC 8.3 7.0 7.0  HGB 11.6* 10.0* 9.5*  HCT 31.4* 26.9* 26.0*  PLT 269 252 262   BMET  Recent Labs  11/27/13 0110 11/27/13 1235 11/28/13 0715  NA 133* 131* 131*  K 3.6* 3.6* 3.7  CL 99 97 95*  CO2 21 21 23   GLUCOSE 107* 118* 104*  BUN 11 9 9   CREATININE 0.55 0.54 0.53  CALCIUM 8.1* 8.4 8.5   LFT  Recent Labs  11/27/13 0110 11/27/13 1235 11/28/13 0715  PROT 5.3* 5.9* 5.5*  ALBUMIN 2.4* 2.6* 2.6*  AST 173* 162* 107*  ALT 277* 270* 212*  ALKPHOS 676* 759* 670*  BILITOT 8.8* 7.8* 5.6*   Surgical pathology Stomach, biopsy, Fundic mass - INVASIVE 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  ASSESMENT:   * Pancreatic head mass with CBD stricture and biliary obstruction.  Gastric mass biopsied:  Invasive adenocarcinoma.. [place] 19-9 is 5.0  S/p 11/26/13 ERCP with placement 7 Fr/5cm stent (plastic per call to endo)  LFTs/jaundice, pruritus steadily  impoving. Never experienced pain, anorexia, nausea PTA.  The pathology results d/w pt and his wife, they were not surprised as Dr Glenn Patterson had given them warning last week.  *  Cardiac arrythmia, noted with exam today. No chest pain, no dyspnea.  * Normocytic anemia. Baseline not known.  * Urinary retention. Foley over weekend, removed 2/2 then replaced later in day. Flomax added.  Urology consult planned for today according to pt. Appreciate Dr Glenn Patterson to this matter.   * Hyponatremia.  Dose of HCTZ decreased.  * Hyperglycemia, mild, no previous issues with blood sugars.    PLAN   *  Per Dr Glenn Patterson.  EUS/ERCP to be arranged for 2/12 at 12:15 (arrival at 11:15), but if Dr Glenn Patterson has cancellation could be done on 2/5  If oncology consult needed, could be done as outpt.  *  Obtain EKG to define arrythmia.     [name]  11/28/2013, 9:24 AM Pager: [phone]

## 2013-11-28 NOTE — Progress Notes (Signed)
PHYSICIAN PROGRESS NOTE  Glenn Patterson:[id number] DOB: 08/18/1923 DOA: 11/24/2013  11/28/2013   PCP: No primary provider on file.  Assessment/Plan: Pancreatic head mass with CBD stricture and biliary obstruction.  - path pending. Mass in gastric fundus biopsied. [place] 19-9 is 5.0  - s/p 11/26/13 ERCP with placement 7 Fr/5cm stent (plastic per call to endo)  - elevated LFTs/jaundice slightly improved.  - Pruritus improved.  - Dr. Ardis Hughs planning EUS for panc lesion next Thursday as an outpatient.  - LFTs downtrending s/p stent. Continue to follow -  DVT prophylaxis  Normocytic anemia - monitor CBC, hemoglobin stable  Urinary retention- Had Foley place, removed today for voiding trial, had to be replaced, may need to go home with foley, follow up with urology outpatient.  Trial of flomax.   Hyponatremia - concerning for dehydration, vs diuretic therapy vs SIADH given presumed malignancy, following BMP  - will decrease dose of home HCTZ to 12.5 mg daily.   Hyperglycemia - Mild elevation of BS noted, check A1c, following   Mild hypokalemia - replete, follow BMP, check magnesium level.   Glaucoma - resume home alphagan/trusopt   Hypertension - stable on home meds   Dyslipidemia - hold lipitor for now until LFTs improve more. Check fasting lipid panel  Code Status: Full Family Communication: Daughter at bedside  HPI/Subjective: Pt reports that his pruritus persists but some better.    Objective: Filed Vitals:   11/28/13 0550  BP: 127/49  Pulse: 70  Temp: 97.4 F (36.3 C)  Resp: 18    Intake/Output Summary (Last 24 hours) at 11/28/13 0836 Last data filed at 11/28/13 0549  Gross per 24 hour  Intake 2161.67 ml  Output   1900 ml  Net 261.67 ml   Filed Weights   11/24/13 1809 11/25/13 0800  Weight: 161 lb 12.8 oz (73.392 kg) 161 lb 12.8 oz (73.392 kg)    Exam:  Gen: NAD, Alert and Oriented,Jaundice slightly improved HEENT: [place]/AT, EOMI, bilateral scleral  icterus  Neck: Supple, no LAD  Lungs: CTA Bilaterally  CV: RRR without M/G/R  ABM: Soft, NTND, +BS  Ext: No C/C/E  Data Reviewed: Basic Metabolic Panel:  Recent Labs Lab 11/24/13 1941 11/25/13 1115 11/27/13 0110 11/27/13 1235 11/27/13 1700  NA 126* 128* 133* 131*  --   K 4.6 3.9 3.6* 3.6*  --   CL 90* 94* 99 97  --   CO2 23 19 21 21   --   GLUCOSE 127* 138* 107* 118*  --   BUN 18 17 11 9   --   CREATININE 0.64 0.55 0.55 0.54  --   CALCIUM 9.1 8.6 8.1* 8.4  --   MG  --   --   --   --  1.8   Liver Function Tests:  Recent Labs Lab 11/24/13 1941 11/25/13 1115 11/27/13 0110 11/27/13 1235  AST 288* 235* 173* 162*  ALT 424* 367* 277* 270*  ALKPHOS 858* 787* 676* 759*  BILITOT 11.1* 11.9* 8.8* 7.8*  PROT 7.1 6.5 5.3* 5.9*  ALBUMIN 3.5 3.0* 2.4* 2.6*    Recent Labs Lab 11/24/13 1941  LIPASE 500*    Recent Labs Lab 11/24/13 2030  AMMONIA 55   CBC:  Recent Labs Lab 11/24/13 1941 11/25/13 1115 11/27/13 0110 11/28/13 0715  WBC 8.2 8.3 7.0 7.0  NEUTROABS 5.2  --   --   --   HGB 12.5* 11.6* 10.0* 9.5*  HCT 34.0* 31.4* 26.9* 26.0*  MCV 88.3 88.0 87.6  88.4  PLT 299 269 252 262   Cardiac Enzymes: No results found for this basename: CKTOTAL, CKMB, CKMBINDEX, TROPONINI,  in the last 168 hours BNP (last 3 results) No results found for this basename: PROBNP,  in the last 8760 hours CBG: No results found for this basename: GLUCAP,  in the last 168 hours  No results found for this or any previous visit (from the past 240 hour(s)).   Studies: Dg Ercp Biliary & Pancreatic Ducts  11/26/2013   CLINICAL DATA:  ERCP for pancreatic mass.  EXAM: ERCP  TECHNIQUE: Multiple spot images obtained with the fluoroscopic device and submitted for interpretation post-procedure.  COMPARISON:  CT, 11/24/2013.  FINDINGS: Two images from an ERCP show cannulization of the common bile duct with subsequent placement of a biliary stent. The proximal duct appears irregular and narrowed  consistent with a stricture. The distal duct is dilated. No duct filling defect is seen on the images submitted.  IMPRESSION: ERCP images as described.  These images were submitted for radiologic interpretation only. Please see the procedural report for the amount of contrast and the fluoroscopy time utilized.   Electronically Signed   By: Lajean Manes M.D.   On: 11/26/2013 10:37   Scheduled Meds: . aspirin  81 mg Oral Daily  . benazepril  10 mg Oral Daily  . brimonidine  1 drop Left Eye BID  . calcium-vitamin D  1 tablet Oral Daily  . cholecalciferol  1,000 Units Oral Daily  . dorzolamide  1 drop Both Eyes BID  . heparin subcutaneous  5,000 Units Subcutaneous Q8H  . hydrochlorothiazide  12.5 mg Oral Daily  . multivitamin with minerals  1 tablet Oral Daily  . tamsulosin  0.4 mg Oral QPC supper   Continuous Infusions: . sodium chloride 1,000 mL (11/27/13 1225)   Principal Problem:   Pancreatic mass Active Problems:   Jaundice   Unspecified essential hypertension   Glaucoma   Hypokalemia   Hyponatremia   Pruritus   Urinary retention   Dyslipidemia   Macular degeneration  Lacy Sofia [facility]  [facility] Pager [phone]. If 7PM-7AM, please contact night-coverage at [email or web address], password [facility] 11/28/2013, 8:36 AM  LOS: 4 days

## 2013-11-28 NOTE — Telephone Encounter (Signed)
Also needs ERCP per Dr Ardis Hughs

## 2013-11-28 NOTE — Telephone Encounter (Signed)
Pt has been scheduled for eus ercp needs to be instructed

## 2013-11-28 NOTE — Telephone Encounter (Signed)
Glenn Patterson to notify pt at discharge today from the hospital

## 2013-11-28 NOTE — Telephone Encounter (Signed)
Message copied by Barron Alvine on Tue Nov 28, 2013 11:49 AM ------      Message from: Milus Banister      Created: Tue Nov 28, 2013 11:41 AM       Maylyn Narvaiz,      This man will likely be sent home from [facility] soon. He needs upper EUS, radial +/- linear, 60 min, MAC + for pancreatic head mass.  If any cancellations, could add for 2/5.  For now, put on 2/12 [facility] EUS schedule. Can you please let Dr. Hilarie Fredrickson and Judson Roch know date, time as well.  Thanks            Heide SparkJuluis Rainier ------

## 2013-11-28 NOTE — Progress Notes (Signed)
Pt. Complained earlier that he has the sensation to void, feeling of fullness in the bladder but unable to urinate. Foley cath was D/c'd 12 noon today. Bladder scan was done, shows >930m in the bladder. On call Md notified and ordered to do in and out cath. Was not able to do in and out cath, some resistance met and small amt of blood came out. Md paged and ordered to place a coude cath. Pt feel so much relief after catheter was inserted. 10048mof dark urine noted after a few minutes.

## 2013-11-28 NOTE — Progress Notes (Signed)
Patient seen, examined, and I agree with the above documentation, including the assessment and plan. Primary gastric cancer confirmed on pathology today This may be unrelated to the pancreatic mass which has yet to be fully delineated.   Our recommendation is to proceed to EUS + ERCP for evaluation of HOP mass with FNA, but also to replace the plastic biliary stent with a metal (more permanent) stent.  This is planned for 12/07/2013 with Dr. Ardis Hughs at [facility].  They are aware of this appt and arrival time.  Check in at 11am at [place] on 12/07/13 He is having no pain now, which is great I do think he needs to meet with an oncologist, but his wife has expressed their desire for quality over quantity, which is very understandable.  Even with this in mind, oncology opinion is important to discuss options.  This appt should be after EUS so that oncology will know if he has 2 primary malignancies Awaiting urology regarding urinary obstruction, foley in place, which he may be discharged with

## 2013-11-28 NOTE — Progress Notes (Signed)
PT Screen Note  Patient Details Name: Glenn Patterson MRN: [id number] DOB: [birth year]   Cancelled Treatment:    Reason Eval/Treat Not Completed: PT screened, no needs identified, will sign off. Spoke with OT who reports pt does not have any OT or PT needs at this time. Will sign off. Thanks.    Weston Anna, MPT Pager: [phone]

## 2013-11-28 NOTE — Evaluation (Signed)
Occupational Therapy Evaluation Patient Details Name: Glenn Patterson MRN: [id number] DOB: 1923/05/26 Today's Date: 11/28/2013 Time: 0093-8182 OT Time Calculation (min): 19 min  OT Assessment / Plan / Recommendation History of present illness Obstructive jaundice, Pancreatic head mass, Gastric fundus mass, Pruritis. S/p ERCP with stent placement on 11/26/13   Clinical Impression   This 80yo male admitted with and underwent above presents to acute OT at an Independent level. Left message for PT that I did not see any PT needs either. Acute OT will sign off.    OT Assessment  Patient does not need any further OT services    Follow Up Recommendations  No OT follow up       Equipment Recommendations  None recommended by OT          Precautions / Restrictions Precautions Precautions: None Restrictions Weight Bearing Restrictions: No       ADL  Transfers/Ambulation Related to ADLs: Independent with all without AD. Pt ambulated around 1/4 of unit and went up and down 4 steps with use of rail without issues. ADL Comments: Independent with all     Acute Rehab OT Goals Patient Stated Goal: go home  Visit Information  Last OT Received On: 11/28/13 Assistance Needed:  (None) History of Present Illness: Obstructive jaundice, Pancreatic head mass, Gastric fundus mass, Pruritis. S/p ERCP with stent placement on 11/26/13       Prior [place] expects to be discharged to:: Private residence Living Arrangements: Spouse/significant other Available Help at Discharge: Family;Available 24 hours/day Type of Home: House Home Access: Stairs to enter [employer] of Steps: 5 Entrance Stairs-Rails: Right;Left Home Layout: One level Home Equipment: None Prior Function Level of Independence: Independent Communication Communication: HOH Dominant Hand: Right         Vision/Perception Vision - History Patient Visual Report: No change from  baseline   Cognition  Cognition Arousal/Alertness: Awake/alert Behavior During Therapy: WFL for tasks assessed/performed Overall Cognitive Status: Within Functional Limits for tasks assessed    Extremity/Trunk Assessment Upper Extremity Assessment Upper Extremity Assessment: Overall WFL for tasks assessed Lower Extremity Assessment Lower Extremity Assessment: Overall WFL for tasks assessed     Mobility Bed Mobility Overal bed mobility: Independent Transfers Overall transfer level: Independent           End of Session OT - End of Session Equipment Utilized During Treatment:  (None) Activity Tolerance: Patient tolerated treatment well Patient left: in chair;with call bell/phone within reach;with family/visitor present Nurse Communication:  (NT: emptied 850cc of urine out of foley)       Almon Register [phone] 11/28/2013, 9:19 AM

## 2013-11-29 ENCOUNTER — Telehealth: Payer: Self-pay | Admitting: *Deleted

## 2013-11-29 DIAGNOSIS — C169 Malignant neoplasm of stomach, unspecified: Secondary | ICD-10-CM

## 2013-11-29 DIAGNOSIS — K8689 Other specified diseases of pancreas: Secondary | ICD-10-CM

## 2013-11-29 NOTE — Progress Notes (Signed)
          Daily Rounding Note  11/29/2013, 9:06 AM  LOS: 5 days   SUBJECTIVE:       Feels well, eating well.  asking if we can remove Foley.  No CP or SOB Note EKG with rate controlled A fib  OBJECTIVE:         Vital signs in last 24 hours:    Temp:  [96.8 F (36 C)-98 F (36.7 C)] 96.8 F (36 C) (02/04 0618) Pulse Rate:  [55-73] 73 (02/04 0618) Resp:  [18] 18 (02/04 0618) BP: (113-134)/(59-63) 133/63 mmHg (02/04 0618) SpO2:  [96 %-98 %] 96 % (02/04 0618) Last BM Date: 11/28/13 General: comfortable, not jaundiced.  Looks well   Heart: irreg, irreg, rate in 70s Chest: clear, no dyspnea or cough Abdomen: soft, NT, ND.  Active BS  Extremities: no CCE Neuro/Psych:  Pleasant, oriented x 3.  No gross weakness.   Intake/Output from previous day: 02/03 0701 - 02/04 0700 In: 1500 [P.O.:1320; I.V.:180] Out: 5050 [Urine:5050]  Intake/Output this shift:    Lab Results:  Recent Labs  11/27/13 0110 11/28/13 0715  WBC 7.0 7.0  HGB 10.0* 9.5*  HCT 26.9* 26.0*  PLT 252 262   BMET  Recent Labs  11/27/13 0110 11/27/13 1235 11/28/13 0715  NA 133* 131* 131*  K 3.6* 3.6* 3.7  CL 99 97 95*  CO2 21 21 23   GLUCOSE 107* 118* 104*  BUN 11 9 9   CREATININE 0.55 0.54 0.53  CALCIUM 8.1* 8.4 8.5   LFT  Recent Labs  11/27/13 0110 11/27/13 1235 11/28/13 0715  PROT 5.3* 5.9* 5.5*  ALBUMIN 2.4* 2.6* 2.6*  AST 173* 162* 107*  ALT 277* 270* 212*  ALKPHOS 676* 759* 670*  BILITOT 8.8* 7.8* 5.6*    ASSESMENT:   * Pancreatic head mass with CBD stricture and biliary obstruction. Gastric mass biopsied: Invasive adenocarcinoma.. [place] 19-9 is 5.0  S/p 11/26/13 ERCP with placement 7 Fr/5cm, plastic, biliary stent LFTs/jaundice, pruritus steadily impoving. Never experienced pain, anorexia, nausea PTA.  *  Atrial fibrillation on EKG yesterday.  Would not start anticoagulation given his need to undergo EUS/ERCP and new diagnosis of  malignancy.  Onset noticed on exam 11/28/13. No chest pain, no dyspnea.  * Normocytic anemia. Baseline not known.  * Urinary retention. Foley over weekend, removed 2/2 then replaced later in day. Flomax added. Foley still in place.  Not clear that urology consult was requested.  * Hyponatremia. Dose of HCTZ decreased. Stable Sodium.  * Hyperglycemia, mild, no previous issues with blood sugars.     PLAN   *  Remove foley catheter.  If able to pee, no need of urology consult, just continue Flomax? *  Wife reminded me that she and pt prefer quality over quantity in terms of life expectancy.      Glenn Patterson  11/29/2013, 9:06 AM Pager: [phone]

## 2013-11-29 NOTE — Progress Notes (Signed)
Patient seen, examined, and I agree with the above documentation, including the assessment and plan. EUS as outpatient next week with ERCP, then oncology consultation

## 2013-11-29 NOTE — Telephone Encounter (Signed)
Message copied by Lance Morin on Wed Nov 29, 2013  2:43 PM ------      Message from: Jerene Bears      Created: Tue Nov 28, 2013  5:51 PM      Regarding: ONCOLOGY REFERRAL       Needs oncology referral to Dr. Benay Spice for gastric cancer      Has panc mass that Ardis Hughs will EUS on 12/07/13.      Should see [place] after EUS in case he has 2 primary cancers      Thanks            JMP             ------

## 2013-11-29 NOTE — Telephone Encounter (Signed)
Oncology referral made. Requested an appt for after 12/07/13 with Dr Benay Spice.

## 2013-11-29 NOTE — Progress Notes (Signed)
Pt unable to void and complained of pain in his lower abd. Bladder scanner complete showing in 900cc in the bladder. Re-inserted 16 french coude catheter as ordered with one attempt very little resistance met, resulting in 1100 cc of bloody urine out. Urine with gross hematuria, dark red and bleeding around catheter insertion site.  Ruthell Rummage, NP notified, new orders received to irrigate catheter every 2 hours as needed for patency, hold am dose of SQ Heparin and CBC in the am. Will continue to monitor pt.

## 2013-11-29 NOTE — Progress Notes (Signed)
Pt's Foley D/C'd per order around 10:45 am today.  Still no void at 1530, so RN bladder scanned pt and got a reading of 595cc in the bladder.  Pt did finally void a very small amount at 1700 (only about 20cc).  Notified Dr Karleen Hampshire.  Went ahead and gave pt his 1800 dose of Flomax.  He doesn't have any discomfort at this time. Will cont to monitor.

## 2013-11-29 NOTE — Progress Notes (Signed)
PHYSICIAN PROGRESS NOTE  PEYTON SPENGLER KYH:[id number] DOB: 14-May-1923 DOA: 11/24/2013  11/29/2013   PCP: No primary provider on file.  Assessment/Plan: Pancreatic head mass with CBD stricture and biliary obstruction.  - path pending. Mass in gastric fundus biopsied.  19-9 is 5.0  - s/p 11/26/13 ERCP with placement 7 Fr/5cm stent (plastic per call to endo)  - elevated LFTs/jaundice slightly improved.  - Pruritus improved.  - Dr. Ardis Hughs planning EUS for panc lesion next Thursday as an outpatient.  - LFTs downtrending s/p stent. Continue to follow -  DVT prophylaxis  Normocytic anemia - monitor CBC, hemoglobin stable  Urinary retention removed today for voiding trial, with a trial of flomax. He was able to void a little today. Bladder scan showed another 275ml of urine. Will continue overnight and discharge in am.   Hyponatremia - concerning for dehydration, vs diuretic therapy vs SIADH given presumed malignancy, following BMP  - will decrease dose of home HCTZ to 12.5 mg daily.   Hyperglycemia - Mild elevation of BS noted, check A1c, following   Mild hypokalemia - replete, follow BMP, check magnesium level.   Glaucoma - resume home alphagan/trusopt   Hypertension - stable on home meds   Dyslipidemia - hold lipitor for now until LFTs improve more. Check fasting lipid panel  Code Status: Full Family Communication: Daughter at bedside  HPI/Subjective: Pt reports that his pruritus persists but some better.    Objective: Filed Vitals:   11/29/13 1346  BP: 130/56  Pulse: 75  Temp: 97.3 F (36.3 C)  Resp: 18    Intake/Output Summary (Last 24 hours) at 11/29/13 1857 Last data filed at 11/29/13 1700  Gross per 24 hour  Intake   1920 ml  Output   3750 ml  Net  -1830 ml   Filed Weights   11/24/13 1809 11/25/13 0800  Weight: 73.392 kg (161 lb 12.8 oz) 73.392 kg (161 lb 12.8 oz)    Exam:  Gen: NAD, Alert and Oriented,Jaundice slightly improved HEENT: [place]/AT, EOMI,  bilateral scleral icterus  Neck: Supple, no LAD  Lungs: CTA Bilaterally  CV: RRR without M/G/R  ABM: Soft, NTND, +BS  Ext: No C/C/E  Data Reviewed: Basic Metabolic Panel:  Recent Labs Lab 11/24/13 1941 11/25/13 1115 11/27/13 0110 11/27/13 1235 11/27/13 1700 11/28/13 0715  NA 126* 128* 133* 131*  --  131*  K 4.6 3.9 3.6* 3.6*  --  3.7  CL 90* 94* 99 97  --  95*  CO2 23 19 21 21   --  23  GLUCOSE 127* 138* 107* 118*  --  104*  BUN 18 17 11 9   --  9  CREATININE 0.64 0.55 0.55 0.54  --  0.53  CALCIUM 9.1 8.6 8.1* 8.4  --  8.5  MG  --   --   --   --  1.8  --    Liver Function Tests:  Recent Labs Lab 11/24/13 1941 11/25/13 1115 11/27/13 0110 11/27/13 1235 11/28/13 0715  AST 288* 235* 173* 162* 107*  ALT 424* 367* 277* 270* 212*  ALKPHOS 858* 787* 676* 759* 670*  BILITOT 11.1* 11.9* 8.8* 7.8* 5.6*  PROT 7.1 6.5 5.3* 5.9* 5.5*  ALBUMIN 3.5 3.0* 2.4* 2.6* 2.6*    Recent Labs Lab 11/24/13 1941 11/28/13 0715  LIPASE 500* 169*    Recent Labs Lab 11/24/13 2030  AMMONIA 55   CBC:  Recent Labs Lab 11/24/13 1941 11/25/13 1115 11/27/13 0110 11/28/13 0715  WBC 8.2 8.3  7.0 7.0  NEUTROABS 5.2  --   --   --   HGB 12.5* 11.6* 10.0* 9.5*  HCT 34.0* 31.4* 26.9* 26.0*  MCV 88.3 88.0 87.6 88.4  PLT 299 269 252 262   Cardiac Enzymes: No results found for this basename: CKTOTAL, CKMB, CKMBINDEX, TROPONINI,  in the last 168 hours BNP (last 3 results) No results found for this basename: PROBNP,  in the last 8760 hours CBG: No results found for this basename: GLUCAP,  in the last 168 hours  No results found for this or any previous visit (from the past 240 hour(s)).   Studies: No results found. Scheduled Meds: . aspirin  81 mg Oral Daily  . benazepril  10 mg Oral Daily  . brimonidine  1 drop Left Eye BID  . calcium-vitamin D  1 tablet Oral Daily  . cholecalciferol  1,000 Units Oral Daily  . dorzolamide  1 drop Both Eyes BID  . heparin subcutaneous  5,000  Units Subcutaneous Q8H  . hydrochlorothiazide  12.5 mg Oral Daily  . multivitamin with minerals  1 tablet Oral Daily  . tamsulosin  0.4 mg Oral QPC supper   Continuous Infusions:   Principal Problem:   Pancreatic mass Active Problems:   Jaundice   Unspecified essential hypertension   Glaucoma   Hypokalemia   Hyponatremia   Pruritus   Urinary retention   Dyslipidemia   Macular degeneration  Ranelle Auker  [facility] Pager [phone]. If 7PM-7AM, please contact night-coverage at [email or web address], password [facility] 11/29/2013, 6:57 PM  LOS: 5 days

## 2013-11-30 ENCOUNTER — Encounter: Payer: Self-pay | Admitting: Gastroenterology

## 2013-11-30 LAB — CBC
HCT: 25.4 % — ABNORMAL LOW (ref 39.0–52.0)
Hemoglobin: 9.2 g/dL — ABNORMAL LOW (ref 13.0–17.0)
MCH: 32.4 pg (ref 26.0–34.0)
MCHC: 36.2 g/dL — ABNORMAL HIGH (ref 30.0–36.0)
MCV: 89.4 fL (ref 78.0–100.0)
Platelets: 312 10*3/uL (ref 150–400)
RBC: 2.84 MIL/uL — ABNORMAL LOW (ref 4.22–5.81)
RDW: 15.7 % — ABNORMAL HIGH (ref 11.5–15.5)
WBC: 7.3 10*3/uL (ref 4.0–10.5)

## 2013-11-30 MED ORDER — ASPIRIN 81 MG PO CHEW
81.0000 mg | CHEWABLE_TABLET | Freq: Every day | ORAL | Status: DC
  Administered 2013-11-30: 81 mg via ORAL
  Filled 2013-11-30: qty 1

## 2013-11-30 MED ORDER — HYDROCHLOROTHIAZIDE 12.5 MG PO CAPS: 12.5000 mg | ORAL_CAPSULE | Freq: Every day | ORAL | Status: DC

## 2013-11-30 MED ORDER — CHOLESTYRAMINE 4 G PO PACK
4.0000 g | PACK | Freq: Three times a day (TID) | ORAL | Status: DC
  Filled 2013-11-30 (×2): qty 1

## 2013-11-30 MED ORDER — TAMSULOSIN HCL 0.4 MG PO CAPS: 0.4000 mg | ORAL_CAPSULE | Freq: Two times a day (BID) | ORAL | Status: AC

## 2013-11-30 MED ORDER — CHOLESTYRAMINE 4 G PO PACK: 4.0000 g | PACK | Freq: Three times a day (TID) | ORAL | Status: DC

## 2013-11-30 NOTE — Care Management Note (Signed)
    Page 1 of 1   11/30/2013     2:36:47 PM   CARE MANAGEMENT NOTE 11/30/2013  Patient:  DEONDRAY, OSPINA   Account Number:  [id number]  Date Initiated:  11/30/2013  Documentation initiated by:  Magdalen Spatz  Subjective/Objective Assessment:     Action/Plan:   Anticipated DC Date:  11/30/2013   Anticipated DC Plan:  [place]         Choice offered to / List presented to:  C-1 Patient        [place] arranged  HH-1 RN      [place].   Status of service:  Completed, signed off Medicare Important Message given?   (If response is "NO", the following Medicare IM given date fields will be blank) Date Medicare IM given:   Date Additional Medicare IM given:    Discharge Disposition:    Per UR Regulation:    If discussed at Long Length of Stay Meetings, dates discussed:    Comments:

## 2013-11-30 NOTE — Discharge Summary (Signed)
Physician Discharge Summary  Glenn Patterson [id number] DOB: [birth year] DOA: 11/24/2013  PCP: No primary provider on file.  Admit date: 11/24/2013 Discharge date: 11/30/2013  Time spent: 35 minutes  Recommendations for Outpatient Follow-up:  1. Follow up with GI and urology as recommended.   Discharge Diagnoses:  Principal Problem:   Pancreatic mass Active Problems:   Jaundice   Unspecified essential hypertension   Glaucoma   Hypokalemia   Hyponatremia   Pruritus   Urinary retention   Dyslipidemia   Macular degeneration   Discharge Condition: improved.   Diet recommendation: REGULAR  Filed Weights   11/24/13 1809 11/25/13 0800  Weight: 73.392 kg (161 lb 12.8 oz) 73.392 kg (161 lb 12.8 oz)    History of present illness:  This is an 80 year old male admitted for obstructive jaundice and pruritis. His symptoms started 2 weeks ago acutely and since that time his pruritis progressively worsened. A CT scan was performed, but it was without contrast. IV access was not able to be obtained during the outpatient scan, however, a mass in the gastric fundus was identified. Intrahepatic and extrahepatic biliary ductal dilation was also identified. The admission CT scan revealed a 12 cm x 13 cm lesion in the head of the pancreas.he underwent ERCP and was found to have CBD obstruction secondary to pancreatic head lsion, and a fundic mass. He underwent biopsy of the mass and results pending. Meanwhile, he developed urinary retention and a foley was placed. Urology consulted spoke to Dr Karsten Ro, recommended keeping the foley in and follow up with him or his partners as outpatient in one week. Spoke to Glenn Patterson and in agreement.  Outpatient EUS per [facility].     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  - path pending. Mass in gastric fundus biopsied. [place] 19-9 is 5.0  - s/p 11/26/13 ERCP with placement 7 Fr/5cm stent (plastic per call to  [facility])  - elevated LFTs/jaundice slightly improved.  - Pruritus improved.  - Dr. Ardis Hughs planning EUS for panc lesion next Thursday as an outpatient.  - LFTs downtrending s/p stent. Continue to follow as outpatient.   Normocytic anemia -  hemoglobin stable   Urinary retention removed yesterday for voiding trial, with a trial of flomax. He was able to void a little today. Bladder scan showed another 900 ml of urine with lower abdominal pain. The catheter was replaced. Urology consulted spoke to Dr Karsten Ro, recommended keeping the foley in and follow up with him or his partners as outpatient in one week. Spoke to Glenn Patterson and in agreement.   Hyponatremia - concerning for dehydration, vs diuretic therapy vs SIADH given presumed malignancy, .- will decrease dose of home HCTZ to 12.5 mg daily.  Hyperglycemia - Mild elevation of BS noted, A1c is 5,.  Mild hypokalemia - repleted Glaucoma - resume home alphagan/trusopt  Hypertension - stable on home meds  Dyslipidemia - hold lipitor for now until LFTs improve more.   Procedures:  ERCP  Consultations:  Urology on 2/5 over the phone  GASTROENTEROLOGY Dr Hilarie Fredrickson.  Discharge Exam: Filed Vitals:   11/30/13 0540  BP: 133/60  Pulse: 86  Temp: 97.9 F (36.6 C)  Resp: 20   Gen: NAD, Alert and Oriented,Jaundice slightly improved  HEENT: [name]/AT, EOMI, bilateral scleral icterus  Neck: Supple, no LAD  Lungs: CTA Bilaterally  CV: RRR without M/G/R  ABM: Soft, NTND, +BS  Ext: No C/C/E   Discharge Instructions  Discharge Orders   Future Orders Complete By Expires   Diet - low sodium heart healthy  As directed    Discharge instructions  As directed    Comments:     Follow up with Dr Hilarie Fredrickson and Dr Janice Norrie as recommended.  Keep the foley in till you see the urologist Dr Janice Norrie in the office.       Medication List    STOP taking these medications       hydrochlorothiazide 25 MG tablet  Commonly known as:  HYDRODIURIL   Replaced by:  hydrochlorothiazide 12.5 MG capsule     simvastatin 80 MG tablet  Commonly known as:  ZOCOR      TAKE these medications       aspirin 81 MG tablet  Take 81 mg by mouth daily.     benazepril 10 MG tablet  Commonly known as:  LOTENSIN  Take 10 mg by mouth daily.     brimonidine 0.1 % Soln  Commonly known as:  ALPHAGAN P  Place 1 drop into the left eye daily.     calcium citrate-vitamin D 315-200 MG-UNIT per tablet  Commonly known as:  CITRACAL+D  Take 1 tablet by mouth daily.     diazepam 5 MG tablet  Commonly known as:  VALIUM  Take 5 mg by mouth 2 (two) times daily as needed for anxiety. For sleep per daughter     dorzolamide 2 % ophthalmic solution  Commonly known as:  TRUSOPT  Place 1 drop into both eyes 2 (two) times daily.     hydrochlorothiazide 12.5 MG capsule  Commonly known as:  MICROZIDE  Take 1 capsule (12.5 mg total) by mouth daily.     multivitamin with minerals Tabs tablet  Take 1 tablet by mouth daily.     OMEGA 3 PO  Take 1 capsule by mouth at bedtime.     OVER THE COUNTER MEDICATION  Take 1 tablet by mouth 2 (two) times daily. Macular Degeneration supplement     tamsulosin 0.4 MG Caps capsule  Commonly known as:  FLOMAX  Take 1 capsule (0.4 mg total) by mouth 2 (two) times daily.     Vitamin D-3 1000 UNITS Caps  Take 1 capsule by mouth daily.       No Known Allergies Follow-up Information   Follow up with [facility] ENDOSCOPY On 12/07/2013. (arrival at 11:15.  go to registration on first floor.  Nothing to eat or drink after midnight the night before. )    Contact information:   [address] [id number] [place] [address] [phone]      Call Nesi, Kirtland Bouchard, MD. (Please call the office to schedule an appointment next week to get your catheter out.)    Specialty:  Urology   Contact information:   [address] [place]  [address] [phone]        The results of significant diagnostics from this hospitalization (including imaging, microbiology, ancillary and laboratory) are listed below for reference.    Significant Diagnostic Studies: Ct Abdomen Pelvis W Contrast  11/24/2013   CLINICAL DATA:  Elevated bilirubin and lipase.  Jaundice  EXAM: CT ABDOMEN AND PELVIS WITH CONTRAST  TECHNIQUE: Multidetector CT imaging of the abdomen and pelvis was performed using the standard protocol following bolus administration of intravenous  contrast.  CONTRAST:  180mL OMNIPAQUE IOHEXOL 300 MG/ML  SOLN  COMPARISON:  None.  FINDINGS: Lung bases are clear.  No pericardial fluid.  There is intrahepatic and extrahepatic biliary duct dilatation. The common bile duct measures 16 mm up to the pancreatic head. There is a abrupt transition at the pancreatic head. There is mild dilatation of the pancreatic duct to 5 mm. There is a potential relative hypo attenuating lesion within the head of the pancreas measuring 13 by 12 mm (image 40, series 2). The gallbladder is distended up to 4.9 cm. No inflammation evident. No focal hepatic lesion. The pancreas itself is atrophic.  The spleen, adrenal glands, kidneys demonstrate no acute findings. There parapelvic cysts in the kidneys.  The stomach contains a rounded masslike lesion in the fundus measuring 4.0 x 3.2 cm (image 26, series 2). This appears to emanate from the gastric mucosa wiht central calcifications. The more distal stomach is normal. The small bowel colon are unremarkable.  Abdominal aorta is normal caliber. No retroperitoneal periportal lymphadenopathy.  No free fluid the pelvis. The prostate gland is enlarged. Bladder is normal. No pelvic lymphadenopathy. Insert negative then  IMPRESSION: 1. Intrahepatic and extrahepatic biliary duct dilatation with marked dilatation of the common bile duct up to the pancreatic head. Potential mass lesion within the pancreatic head. Recommend a ERCP/EUS for  relief of the biliary obstruction and sampling of the potential pancreatic head lesion. 2. Mild dilatation of the pancreatic duct and associated pancreatic atrophy would also be consistent with a pancreatic head mass lesion. Recommend correlation with tumor marker CA 19 9. 3. There is a is potential polypoid 3 cm lesion extending from the gastric mucosa of the stomach fundus. This could represent a primary gastric neoplasm. Recommend optical evaluation at the same time as the above recommended ERCP/EUS. 4. Prostate hypertrophy.   Electronically Signed   By: Suzy Bouchard M.D.   On: 11/24/2013 23:52   Dg Ercp Biliary & Pancreatic Ducts  11/26/2013   CLINICAL DATA:  ERCP for pancreatic mass.  EXAM: ERCP  TECHNIQUE: Multiple spot images obtained with the fluoroscopic device and submitted for interpretation post-procedure.  COMPARISON:  CT, 11/24/2013.  FINDINGS: Two images from an ERCP show cannulization of the common bile duct with subsequent placement of a biliary stent. The proximal duct appears irregular and narrowed consistent with a stricture. The distal duct is dilated. No duct filling defect is seen on the images submitted.  IMPRESSION: ERCP images as described.  These images were submitted for radiologic interpretation only. Please see the procedural report for the amount of contrast and the fluoroscopy time utilized.   Electronically Signed   By: Lajean Manes M.D.   On: 11/26/2013 10:37    Microbiology: No results found for this or any previous visit (from the past 240 hour(s)).   Labs: Basic Metabolic Panel:  Recent Labs Lab 11/24/13 1941 11/25/13 1115 11/27/13 0110 11/27/13 1235 11/27/13 1700 11/28/13 0715  NA 126* 128* 133* 131*  --  131*  K 4.6 3.9 3.6* 3.6*  --  3.7  CL 90* 94* 99 97  --  95*  CO2 23 19 21 21   --  23  GLUCOSE 127* 138* 107* 118*  --  104*  BUN 18 17 11 9   --  9  CREATININE 0.64 0.55 0.55 0.54  --  0.53  CALCIUM 9.1 8.6 8.1* 8.4  --  8.5  MG  --   --   --    --  1.8  --    Liver Function Tests:  Recent Labs Lab 11/24/13 1941 11/25/13 1115 11/27/13 0110 11/27/13 1235 11/28/13 0715  AST 288* 235* 173* 162* 107*  ALT 424* 367* 277* 270* 212*  ALKPHOS 858* 787* 676* 759* 670*  BILITOT 11.1* 11.9* 8.8* 7.8* 5.6*  PROT 7.1 6.5 5.3* 5.9* 5.5*  ALBUMIN 3.5 3.0* 2.4* 2.6* 2.6*    Recent Labs Lab 11/24/13 1941 11/28/13 0715  LIPASE 500* 169*    Recent Labs Lab 11/24/13 2030  AMMONIA 55   CBC:  Recent Labs Lab 11/24/13 1941 11/25/13 1115 11/27/13 0110 11/28/13 0715 11/30/13 0610  WBC 8.2 8.3 7.0 7.0 7.3  NEUTROABS 5.2  --   --   --   --   HGB 12.5* 11.6* 10.0* 9.5* 9.2*  HCT 34.0* 31.4* 26.9* 26.0* 25.4*  MCV 88.3 88.0 87.6 88.4 89.4  PLT 299 269 252 262 312   Cardiac Enzymes: No results found for this basename: CKTOTAL, CKMB, CKMBINDEX, TROPONINI,  in the last 168 hours BNP: BNP (last 3 results) No results found for this basename: PROBNP,  in the last 8760 hours CBG: No results found for this basename: GLUCAP,  in the last 168 hours     Signed:  Amour Cutrone  [facility] 11/30/2013, 11:19 AM

## 2013-12-01 ENCOUNTER — Emergency Department (HOSPITAL_COMMUNITY): Payer: Medicare HMO

## 2013-12-01 ENCOUNTER — Inpatient Hospital Stay (HOSPITAL_COMMUNITY)
Admission: EM | Admit: 2013-12-01 | Discharge: 2013-12-08 | DRG: 872 | Disposition: A | Discharge status: Home / Self Care | Payer: Medicare HMO | Attending: Internal Medicine | Admitting: Internal Medicine

## 2013-12-01 ENCOUNTER — Encounter (HOSPITAL_COMMUNITY): Payer: Self-pay | Admitting: Emergency Medicine

## 2013-12-01 DIAGNOSIS — N3090 Cystitis, unspecified without hematuria: Secondary | ICD-10-CM | POA: Diagnosis present

## 2013-12-01 DIAGNOSIS — E785 Hyperlipidemia, unspecified: Secondary | ICD-10-CM | POA: Diagnosis present

## 2013-12-01 DIAGNOSIS — N138 Other obstructive and reflux uropathy: Secondary | ICD-10-CM | POA: Diagnosis present

## 2013-12-01 DIAGNOSIS — R339 Retention of urine, unspecified: Secondary | ICD-10-CM | POA: Diagnosis present

## 2013-12-01 DIAGNOSIS — K8689 Other specified diseases of pancreas: Secondary | ICD-10-CM | POA: Diagnosis present

## 2013-12-01 DIAGNOSIS — N401 Enlarged prostate with lower urinary tract symptoms: Secondary | ICD-10-CM | POA: Diagnosis present

## 2013-12-01 DIAGNOSIS — H3530 Unspecified macular degeneration: Secondary | ICD-10-CM | POA: Diagnosis present

## 2013-12-01 DIAGNOSIS — I10 Essential (primary) hypertension: Secondary | ICD-10-CM | POA: Diagnosis present

## 2013-12-01 DIAGNOSIS — K869 Disease of pancreas, unspecified: Secondary | ICD-10-CM

## 2013-12-01 DIAGNOSIS — I4891 Unspecified atrial fibrillation: Secondary | ICD-10-CM | POA: Diagnosis present

## 2013-12-01 DIAGNOSIS — C250 Malignant neoplasm of head of pancreas: Secondary | ICD-10-CM | POA: Diagnosis present

## 2013-12-01 DIAGNOSIS — A419 Sepsis, unspecified organism: Principal | ICD-10-CM | POA: Diagnosis present

## 2013-12-01 DIAGNOSIS — H409 Unspecified glaucoma: Secondary | ICD-10-CM | POA: Diagnosis present

## 2013-12-01 DIAGNOSIS — R17 Unspecified jaundice: Secondary | ICD-10-CM

## 2013-12-01 DIAGNOSIS — I959 Hypotension, unspecified: Secondary | ICD-10-CM

## 2013-12-01 DIAGNOSIS — N320 Bladder-neck obstruction: Secondary | ICD-10-CM | POA: Diagnosis present

## 2013-12-01 DIAGNOSIS — E871 Hypo-osmolality and hyponatremia: Secondary | ICD-10-CM | POA: Diagnosis present

## 2013-12-01 DIAGNOSIS — N390 Urinary tract infection, site not specified: Secondary | ICD-10-CM | POA: Diagnosis present

## 2013-12-01 DIAGNOSIS — R55 Syncope and collapse: Secondary | ICD-10-CM | POA: Diagnosis present

## 2013-12-01 DIAGNOSIS — R319 Hematuria, unspecified: Secondary | ICD-10-CM | POA: Diagnosis present

## 2013-12-01 DIAGNOSIS — I951 Orthostatic hypotension: Secondary | ICD-10-CM | POA: Diagnosis present

## 2013-12-01 DIAGNOSIS — C162 Malignant neoplasm of body of stomach: Secondary | ICD-10-CM | POA: Diagnosis present

## 2013-12-01 DIAGNOSIS — E7800 Pure hypercholesterolemia, unspecified: Secondary | ICD-10-CM | POA: Diagnosis present

## 2013-12-01 LAB — COMPREHENSIVE METABOLIC PANEL
ALBUMIN: 3.1 g/dL — AB (ref 3.5–5.2)
ALT: 206 U/L — ABNORMAL HIGH (ref 0–53)
AST: 131 U/L — AB (ref 0–37)
Alkaline Phosphatase: 649 U/L — ABNORMAL HIGH (ref 39–117)
BUN: 14 mg/dL (ref 6–23)
CALCIUM: 9.1 mg/dL (ref 8.4–10.5)
CO2: 22 mEq/L (ref 19–32)
CREATININE: 0.66 mg/dL (ref 0.50–1.35)
Chloride: 92 mEq/L — ABNORMAL LOW (ref 96–112)
GFR calc Af Amer: >90 mL/min (ref >90)
GFR calc non Af Amer: 84 mL/min — ABNORMAL LOW (ref >90)
Glucose, Bld: 125 mg/dL — ABNORMAL HIGH (ref 70–99)
Potassium: 3.8 mEq/L (ref 3.7–5.3)
Sodium: 130 mEq/L — ABNORMAL LOW (ref 137–147)
TOTAL PROTEIN: 6.7 g/dL (ref 6.0–8.3)
Total Bilirubin: 4.2 mg/dL — ABNORMAL HIGH (ref 0.3–1.2)

## 2013-12-01 LAB — CBC WITH DIFFERENTIAL/PLATELET
BASOS ABS: 0 10*3/uL (ref 0.0–0.1)
BASOS PCT: 0 % (ref 0–1)
EOS ABS: 0.2 10*3/uL (ref 0.0–0.7)
EOS PCT: 2 % (ref 0–5)
HEMATOCRIT: 28.4 % — AB (ref 39.0–52.0)
Hemoglobin: 10.2 g/dL — ABNORMAL LOW (ref 13.0–17.0)
Lymphocytes Relative: 14 % (ref 12–46)
Lymphs Abs: 1.4 10*3/uL (ref 0.7–4.0)
MCH: 32.5 pg (ref 26.0–34.0)
MCHC: 35.9 g/dL (ref 30.0–36.0)
MCV: 90.4 fL (ref 78.0–100.0)
MONO ABS: 1.2 10*3/uL — AB (ref 0.1–1.0)
Monocytes Relative: 13 % — ABNORMAL HIGH (ref 3–12)
Neutro Abs: 6.8 10*3/uL (ref 1.7–7.7)
Neutrophils Relative %: 71 % (ref 43–77)
Platelets: 394 10*3/uL (ref 150–400)
RBC: 3.14 MIL/uL — ABNORMAL LOW (ref 4.22–5.81)
RDW: 16.1 % — AB (ref 11.5–15.5)
WBC: 9.5 10*3/uL (ref 4.0–10.5)

## 2013-12-01 LAB — TROPONIN I: Troponin I: <0.3 ng/mL (ref <0.30)

## 2013-12-01 LAB — URINALYSIS, ROUTINE W REFLEX MICROSCOPIC
GLUCOSE, UA: NEGATIVE mg/dL
Ketones, ur: 15 mg/dL — AB
Nitrite: POSITIVE — AB
Protein, ur: >300 mg/dL — AB
SPECIFIC GRAVITY, URINE: 1.022 (ref 1.005–1.030)
Urobilinogen, UA: 1 mg/dL (ref 0.0–1.0)
pH: 6.5 (ref 5.0–8.0)

## 2013-12-01 LAB — ABO/RH: ABO/RH(D): A POS

## 2013-12-01 LAB — URINE MICROSCOPIC-ADD ON

## 2013-12-01 LAB — PROTIME-INR
INR: 0.98 (ref 0.00–1.49)
PROTHROMBIN TIME: 12.8 s (ref 11.6–15.2)

## 2013-12-01 LAB — CG4 I-STAT (LACTIC ACID): Lactic Acid, Venous: 1.7 mmol/L (ref 0.5–2.2)

## 2013-12-01 MED ORDER — MORPHINE SULFATE 2 MG/ML IJ SOLN
2.0000 mg | Freq: Once | INTRAMUSCULAR | Status: AC
  Administered 2013-12-01: 2 mg via INTRAVENOUS
  Filled 2013-12-01: qty 1

## 2013-12-01 MED ORDER — ONDANSETRON HCL 4 MG/2ML IJ SOLN
4.0000 mg | Freq: Once | INTRAMUSCULAR | Status: AC
  Administered 2013-12-01: 4 mg via INTRAVENOUS
  Filled 2013-12-01: qty 2

## 2013-12-01 MED ORDER — ONDANSETRON HCL 4 MG/2ML IJ SOLN: 4.0000 mg | Freq: Once | INTRAMUSCULAR | Status: DC

## 2013-12-01 MED ORDER — CALCIUM CARBONATE-VITAMIN D 500-200 MG-UNIT PO TABS
1.0000 | ORAL_TABLET | Freq: Every day | ORAL | Status: DC
  Administered 2013-12-02 – 2013-12-08 (×7): 1 via ORAL
  Filled 2013-12-01 (×8): qty 1

## 2013-12-01 MED ORDER — CALCIUM CITRATE-VITAMIN D 315-200 MG-UNIT PO TABS: 1.0000 | ORAL_TABLET | Freq: Every day | ORAL | Status: DC

## 2013-12-01 MED ORDER — BENAZEPRIL HCL 10 MG PO TABS
10.0000 mg | ORAL_TABLET | Freq: Every day | ORAL | Status: DC
  Administered 2013-12-02: 10 mg via ORAL
  Filled 2013-12-01: qty 1

## 2013-12-01 MED ORDER — LIDOCAINE HCL 2 % EX GEL
Freq: Once | CUTANEOUS | Status: AC
  Administered 2013-12-01: 20 via URETHRAL
  Filled 2013-12-01: qty 20

## 2013-12-01 MED ORDER — OMEGA-3-ACID ETHYL ESTERS 1 G PO CAPS
1.0000 g | ORAL_CAPSULE | Freq: Every day | ORAL | Status: DC
  Administered 2013-12-02 – 2013-12-07 (×6): 1 g via ORAL
  Filled 2013-12-01 (×7): qty 1

## 2013-12-01 MED ORDER — DEXTROSE 5 % IV SOLN
1.0000 g | INTRAVENOUS | Status: DC
  Administered 2013-12-02 – 2013-12-03 (×2): 1 g via INTRAVENOUS
  Filled 2013-12-01 (×3): qty 10

## 2013-12-01 MED ORDER — BRIMONIDINE TARTRATE 0.2 % OP SOLN
1.0000 [drp] | Freq: Every day | OPHTHALMIC | Status: DC
  Administered 2013-12-02 – 2013-12-08 (×7): 1 [drp] via OPHTHALMIC
  Filled 2013-12-01: qty 5

## 2013-12-01 MED ORDER — DORZOLAMIDE HCL 2 % OP SOLN
1.0000 [drp] | Freq: Two times a day (BID) | OPHTHALMIC | Status: DC
  Administered 2013-12-02 – 2013-12-08 (×14): 1 [drp] via OPHTHALMIC
  Filled 2013-12-01: qty 10

## 2013-12-01 MED ORDER — SILODOSIN 4 MG PO CAPS
4.0000 mg | ORAL_CAPSULE | Freq: Every day | ORAL | Status: DC
  Administered 2013-12-02 – 2013-12-08 (×7): 4 mg via ORAL
  Filled 2013-12-01 (×8): qty 1

## 2013-12-01 MED ORDER — FINASTERIDE 5 MG PO TABS
5.0000 mg | ORAL_TABLET | Freq: Every day | ORAL | Status: DC
  Administered 2013-12-01 – 2013-12-07 (×7): 5 mg via ORAL
  Filled 2013-12-01 (×8): qty 1

## 2013-12-01 MED ORDER — ADULT MULTIVITAMIN W/MINERALS CH
1.0000 | ORAL_TABLET | Freq: Every day | ORAL | Status: DC
  Administered 2013-12-02 – 2013-12-08 (×7): 1 via ORAL
  Filled 2013-12-01 (×7): qty 1

## 2013-12-01 MED ORDER — DEXTROSE 5 % IV SOLN
1.0000 g | Freq: Once | INTRAVENOUS | Status: AC
  Administered 2013-12-01: 1 g via INTRAVENOUS
  Filled 2013-12-01: qty 10

## 2013-12-01 MED ORDER — VITAMIN D-3 25 MCG (1000 UT) PO CAPS: 1000.0000 [IU] | ORAL_CAPSULE | Freq: Every day | ORAL | Status: DC

## 2013-12-01 MED ORDER — VITAMIN D3 25 MCG (1000 UNIT) PO TABS
1000.0000 [IU] | ORAL_TABLET | Freq: Every day | ORAL | Status: DC
  Administered 2013-12-02 – 2013-12-08 (×7): 1000 [IU] via ORAL
  Filled 2013-12-01 (×7): qty 1

## 2013-12-01 MED ORDER — NON FORMULARY: 4.0000 mg | Freq: Every day | Status: DC

## 2013-12-01 MED ORDER — OMEGA 3 1000 MG PO CAPS: 1.0000 | ORAL_CAPSULE | Freq: Every day | ORAL | Status: DC

## 2013-12-01 MED ORDER — MORPHINE SULFATE 2 MG/ML IJ SOLN: 2.0000 mg | Freq: Once | INTRAMUSCULAR | Status: DC

## 2013-12-01 NOTE — ED Notes (Signed)
Dr. Jerilynn Mages. Allen into room

## 2013-12-01 NOTE — ED Notes (Signed)
Dr. Jerilynn Mages. Zenia Resides speaking with family x2 outside of room.

## 2013-12-01 NOTE — ED Notes (Signed)
GU MD in to see & assess pt, at [facility]. Family x2 at [facility]. Pt alert, NAD, calm, interactive.

## 2013-12-01 NOTE — ED Notes (Signed)
Pt sleeping/resting bladder is draining into foley bag, pt feels much better.

## 2013-12-01 NOTE — ED Notes (Signed)
Urologist at bedside.

## 2013-12-01 NOTE — ED Notes (Signed)
EDP states 2mg  of morphine Q 15 min until pt reaches 10mg  of morphine, then ask for more medications. Pt placed on 2L of O2 for comfort.

## 2013-12-01 NOTE — ED Notes (Signed)
Per EMS: pt from home. Pt passed out at home. Pt was altered when he came back too, unable to recqnize family. Pt also having blood in his catheter and he was complaining of pain in his pelvic region. Pt is on blood thinners. Pt was sent home with catheter and leg bag due to inability to urinate.

## 2013-12-01 NOTE — ED Notes (Signed)
Pt has an approx 2inch clot at the tip of his penis, pulled out of catheter.

## 2013-12-01 NOTE — ED Provider Notes (Signed)
CSN: [id number]     Arrival date & time 12/01/13  1744 History   First MD Initiated Contact with Patient 12/01/13 1746     Chief Complaint  Patient presents with  . Hematuria  . Loss of Consciousness    HPI: Glenn Patterson is an 80 yo M with history of HTN, newly diagnosed pancreatitic mass, new onset urinary retention who presents with hematuria and syncope. He was discharged from the Hospitalist service yesterday after he initially presented with painless jaundice. A pancreatic mass was identified and a biliary stent was placed. Following this procedure he developed urinary retention, he failed several voiding trials and was DC'd yesterday with a urinary catheter. Last night his wife noted hematuria that worsening today. He has not had any output from his urinary catheter since noon today. He began to have worsening lower abdominal pressure related to his urinary retention. He went to the bathroom and was sitting on the toilet, attempting to pass urine and then had a syncopal episode. He does not recall becoming lightheaded or    Past Medical History  Diagnosis Date  . Hypertension   . Hypercholesteremia   . Pancreatic mass   . Glaucoma   . Macular degeneration    Past Surgical History  Procedure Laterality Date  . Ercp N/A 11/26/2013    Procedure: ENDOSCOPIC RETROGRADE CHOLANGIOPANCREATOGRAPHY (ERCP);  Surgeon: Beryle Beams, MD;  Location: [facility] ENDOSCOPY;  Service: Endoscopy;  Laterality: N/A;   History reviewed. No pertinent family history. History  Substance Use Topics  . Smoking status: Never Smoker   . Smokeless tobacco: Never Used  . Alcohol Use: 4.2 oz/week    7 Cans of beer per week     Comment: drinks a beer a day    Review of Systems  Constitutional: Positive for fatigue. Negative for fever, chills and appetite change.  HENT: Negative for congestion.   Eyes: Negative for photophobia and visual disturbance.  Respiratory: Negative for cough and shortness of breath.    Cardiovascular: Negative for chest pain and leg swelling.  Gastrointestinal: Positive for abdominal pain (lower abdominal pressure). Negative for nausea, vomiting, diarrhea and constipation.  Genitourinary: Positive for hematuria, decreased urine volume and difficulty urinating. Negative for dysuria and frequency.  Musculoskeletal: Negative for arthralgias, back pain and myalgias.  Skin: Positive for color change (jaundice) and pallor. Negative for wound.  Neurological: Positive for syncope and light-headedness. Negative for dizziness and headaches.  Psychiatric/Behavioral: Negative for confusion and agitation.  All other systems reviewed and are negative.    Allergies  Review of patient's allergies indicates no known allergies.  Home Medications   Current Outpatient Rx  Name  Route  Sig  Dispense  Refill  . aspirin 81 MG tablet   Oral   Take 81 mg by mouth daily.         . benazepril (LOTENSIN) 10 MG tablet   Oral   Take 10 mg by mouth daily.         . brimonidine (ALPHAGAN P) 0.1 % SOLN   Left Eye   Place 1 drop into the left eye daily.         . calcium citrate-vitamin D (CITRACAL+D) 315-200 MG-UNIT per tablet   Oral   Take 1 tablet by mouth daily.         . Cholecalciferol (VITAMIN D-3) 1000 UNITS CAPS   Oral   Take 1,000 Units by mouth daily.          . dorzolamide (TRUSOPT)  2 % ophthalmic solution   Both Eyes   Place 1 drop into both eyes 2 (two) times daily.         . hydrochlorothiazide (MICROZIDE) 12.5 MG capsule   Oral   Take 1 capsule (12.5 mg total) by mouth daily.   30 capsule   0   . Multiple Vitamin (MULTIVITAMIN WITH MINERALS) TABS tablet   Oral   Take 1 tablet by mouth daily.         . Omega-3 Fatty Acids (OMEGA 3 PO)   Oral   Take 1 capsule by mouth at bedtime.         Marland Kitchen OVER THE COUNTER MEDICATION   Oral   Take 1 tablet by mouth 2 (two) times daily. Macular Degeneration supplement         . tamsulosin (FLOMAX) 0.4 MG  CAPS capsule   Oral   Take 1 capsule (0.4 mg total) by mouth 2 (two) times daily.   30 capsule   0    BP 115/78  Pulse 83  Resp 15  SpO2 93% Physical Exam  Nursing note and vitals reviewed. Constitutional: He is oriented to person, place, and time. He appears ill. No distress.  Chronically ill appearing, elderly male, sitting up in bed, no distress.   HENT:  Head: Normocephalic and atraumatic.  Mouth/Throat: Oropharynx is clear and moist. Mucous membranes are dry.  Eyes: EOM are normal. Pupils are equal, round, and reactive to light.  Pale conjunctiva   Neck: Normal range of motion. Neck supple.  Cardiovascular: Normal rate and intact distal pulses.  An irregularly irregular rhythm present. Exam reveals distant heart sounds.   Pulmonary/Chest: Effort normal and breath sounds normal. No respiratory distress.  Abdominal: Soft. Bowel sounds are normal. There is tenderness in the suprapubic area. There is no rebound and no guarding.  Genitourinary:  Bleeding from meatus. Foley in place. Red urine in bag.   Musculoskeletal: Normal range of motion. He exhibits no edema and no tenderness.  Trace LE edema.   Neurological: He is alert and oriented to person, place, and time. No cranial nerve deficit. Coordination normal.  Skin: Skin is warm and dry. No rash noted.  Psychiatric: He has a normal mood and affect. His behavior is normal.    ED Course  Procedures (including critical care time) Labs Review Labs Reviewed  CBC WITH DIFFERENTIAL - Abnormal; Notable for the following:    RBC 3.14 (*)    Hemoglobin 10.2 (*)    HCT 28.4 (*)    RDW 16.1 (*)    Monocytes Relative 13 (*)    Monocytes Absolute 1.2 (*)    All other components within normal limits  COMPREHENSIVE METABOLIC PANEL - Abnormal; Notable for the following:    Sodium 130 (*)    Chloride 92 (*)    Glucose, Bld 125 (*)    Albumin 3.1 (*)    AST 131 (*)    ALT 206 (*)    Alkaline Phosphatase 649 (*)    Total  Bilirubin 4.2 (*)    GFR calc non Af Amer 84 (*)    All other components within normal limits  URINE CULTURE  TROPONIN I  URINALYSIS, ROUTINE W REFLEX MICROSCOPIC  CG4 I-STAT (LACTIC ACID)  TYPE AND SCREEN  ABO/RH   Imaging Review Dg Chest Portable 1 View  12/01/2013   CLINICAL DATA:  Hematuria.  Loss of consciousness.  Fall.  EXAM: PORTABLE CHEST - 1 VIEW  COMPARISON:  12/07/2003  FINDINGS: The heart size and mediastinal contours are within normal limits. Both lungs are clear. The visualized skeletal structures are unremarkable.  IMPRESSION: No active disease.   Electronically Signed   By: Rolm Baptise M.D.   On: 12/01/2013 18:39    EKG Interpretation    Date/Time:  Friday December 01 2013 17:54:52 EST Ventricular Rate:  79 PR Interval:    QRS Duration: 91 QT Interval:  429 QTC Calculation: 492 R Axis:   -18 Text Interpretation:  Atrial fibrillation Ventricular premature complex Abnormal R-wave progression, early transition Left ventricular hypertrophy Anterior Q waves, possibly due to LVH No significant change since last tracing Confirmed by BEATON  MD, ROBERT ([id number]) on 12/01/2013 6:03:17 PM            MDM  80 yo M with history of HTN, newly diagnosed pancreatitic mass, new onset urinary retention who presents with hematuria and syncope. Initial ECG shows atrial fib, mildly prolonged QTc, no active ischemia. Mild hypotension. He was in significant discomfort from urinary retention. Attempted to irrigate catheter after irrigation but patient was unable to tolerate. Consulted urology, they placed a hematuria catheter and irrigated his bladder. He had immediate relief of his pain following this intervention. Doubt cardiac etiology of symptoms as troponin is negative, no ischemic changes on ECG, no chest pain or SOB. He did appear pale on exam, likely from hematuria. Initial Hgb stable at 10.2. His urine does appear infected as he has positive nitrites, covered with Rocephin. He does  have significant abnormalities of his biliary labs but stable since last admission. Given syncopal episode and need for continuous bladder irrigation will admit to Hospitalist. The patient and his family were in agreement with plan. He remained HD stable in the ED.   Reviewed imaging, labs, ECG and previous medical records, utilized in MDM  Discussed case with Dr. Audie Pinto  Clinical Impression 1. Hemorrhagic cystitis 2. Urinary retention, resolved 3. Likely vasovagal syncope    Louretta Shorten, MD 12/02/13 2103

## 2013-12-01 NOTE — ED Notes (Addendum)
Attempting to irrigate foley, able to fluch 26mL of fluid into bladder without difficulty but cannot pull back fluid nor will fluid drain when left to drain. Second time attempting to irrigate, pt unable to tolerate due to pain with irrigation.

## 2013-12-01 NOTE — Consult Note (Signed)
Reason for Consult:Hematuria, Urinary Retention, Syncopal Episode  Referring Physician: Lourena Simmonds MD  Glenn Patterson is an 80 y.o. male.   HPI:   1 - Hematuria, Urinary Retention - Pt with new hematuria culminating with frank clots and retention today at home. Recently DC'd from hospital stay during which stomach and likely pancreatic cancer discovered and pt developed new urinary retention following GI procedure and failed trial of void x several. Only current blood thinner ASA. No prior episodes. CT 10/2013 w/o large GU mass, but does reveal massive prostate. UA today with +nitrites.   2 - Massive Prostatic Hypertrophy - 250gm prostate by imaging. Minimal baseline voiding smptoms prior to recent admission.   Today Glenn Patterson is seen as urgent ER consult for above. Several small catheters placed in ER but little efflux and clots at meatus. Had syncopal episode and lost consciousness and bowel control prior to coming tonight while trying to valsalva clots through / around catheter.   Past Medical History  Diagnosis Date  . Hypertension   . Hypercholesteremia   . Pancreatic mass   . Glaucoma   . Macular degeneration     Past Surgical History  Procedure Laterality Date  . Ercp N/A 11/26/2013    Procedure: ENDOSCOPIC RETROGRADE CHOLANGIOPANCREATOGRAPHY (ERCP);  Surgeon: Theda Belfast, MD;  Location: [facility] ENDOSCOPY;  Service: Endoscopy;  Laterality: N/A;    History reviewed. No pertinent family history.  Social History:  reports that he has never smoked. He has never used smokeless tobacco. He reports that he drinks about 4.2 ounces of alcohol per week. He reports that he does not use illicit drugs.  Allergies: No Known Allergies  Medications: I have reviewed the patient's current medications.  Results for orders placed during the hospital encounter of 12/01/13 (from the past 48 hour(s))  TYPE AND SCREEN     Status: None   Collection Time    12/01/13  6:20 PM      Result Value Range    ABO/RH(D) A POS     Antibody Screen NEG     Sample Expiration 12/04/2013    ABO/RH     Status: None   Collection Time    12/01/13  6:20 PM      Result Value Range   ABO/RH(D) A POS    CBC WITH DIFFERENTIAL     Status: Abnormal   Collection Time    12/01/13  6:28 PM      Result Value Range   WBC 9.5  4.0 - 10.5 K/uL   RBC 3.14 (*) 4.22 - 5.81 MIL/uL   Hemoglobin 10.2 (*) 13.0 - 17.0 g/dL   HCT 91.1 (*) 55.2 - 91.9 %   MCV 90.4  78.0 - 100.0 fL   MCH 32.5  26.0 - 34.0 pg   MCHC 35.9  30.0 - 36.0 g/dL   RDW 77.7 (*) 15.4 - 29.8 %   Platelets 394  150 - 400 K/uL   Neutrophils Relative % 71  43 - 77 %   Neutro Abs 6.8  1.7 - 7.7 K/uL   Lymphocytes Relative 14  12 - 46 %   Lymphs Abs 1.4  0.7 - 4.0 K/uL   Monocytes Relative 13 (*) 3 - 12 %   Monocytes Absolute 1.2 (*) 0.1 - 1.0 K/uL   Eosinophils Relative 2  0 - 5 %   Eosinophils Absolute 0.2  0.0 - 0.7 K/uL   Basophils Relative 0  0 - 1 %   Basophils Absolute  0.0  0.0 - 0.1 K/uL  COMPREHENSIVE METABOLIC PANEL     Status: Abnormal   Collection Time    12/01/13  6:28 PM      Result Value Range   Sodium 130 (*) 137 - 147 mEq/L   Potassium 3.8  3.7 - 5.3 mEq/L   Chloride 92 (*) 96 - 112 mEq/L   CO2 22  19 - 32 mEq/L   Glucose, Bld 125 (*) 70 - 99 mg/dL   BUN 14  6 - 23 mg/dL   Creatinine, Ser 0.66  0.50 - 1.35 mg/dL   Calcium 9.1  8.4 - 10.5 mg/dL   Total Protein 6.7  6.0 - 8.3 g/dL   Albumin 3.1 (*) 3.5 - 5.2 g/dL   AST 131 (*) 0 - 37 U/L   ALT 206 (*) 0 - 53 U/L   Alkaline Phosphatase 649 (*) 39 - 117 U/L   Total Bilirubin 4.2 (*) 0.3 - 1.2 mg/dL   GFR calc non Af Amer 84 (*) >90 mL/min   GFR calc Af Amer >90  >90 mL/min   Comment: (NOTE)     The eGFR has been calculated using the CKD EPI equation.     This calculation has not been validated in all clinical situations.     eGFR's persistently <90 mL/min signify possible Chronic Kidney     Disease.  TROPONIN I     Status: None   Collection Time    12/01/13  6:28  PM      Result Value Range   Troponin I <0.30  <0.30 ng/mL   Comment:            Due to the release kinetics of cTnI,     a negative result within the first hours     of the onset of symptoms does not rule out     myocardial infarction with certainty.     If myocardial infarction is still suspected,     repeat the test at appropriate intervals.  PROTIME-INR     Status: None   Collection Time    12/01/13  6:28 PM      Result Value Range   Prothrombin Time 12.8  11.6 - 15.2 seconds   INR 0.98  0.00 - 1.49  CG4 I-STAT (LACTIC ACID)     Status: None   Collection Time    12/01/13  6:41 PM      Result Value Range   Lactic Acid, Venous 1.70  0.5 - 2.2 mmol/L  URINALYSIS, ROUTINE W REFLEX MICROSCOPIC     Status: Abnormal   Collection Time    12/01/13  8:16 PM      Result Value Range   Color, Urine RED (*) YELLOW   Comment: BIOCHEMICALS MAY BE AFFECTED BY COLOR   APPearance TURBID (*) CLEAR   Specific Gravity, Urine 1.022  1.005 - 1.030   pH 6.5  5.0 - 8.0   Glucose, UA NEGATIVE  NEGATIVE mg/dL   Hgb urine dipstick LARGE (*) NEGATIVE   Bilirubin Urine LARGE (*) NEGATIVE   Ketones, ur 15 (*) NEGATIVE mg/dL   Protein, ur >300 (*) NEGATIVE mg/dL   Urobilinogen, UA 1.0  0.0 - 1.0 mg/dL   Nitrite POSITIVE (*) NEGATIVE   Leukocytes, UA MODERATE (*) NEGATIVE  URINE MICROSCOPIC-ADD ON     Status: Abnormal   Collection Time    12/01/13  8:16 PM      Result Value Range   Squamous Epithelial / LPF  FEW (*) RARE   WBC, UA 7-10  <3 WBC/hpf   RBC / HPF TOO NUMEROUS TO COUNT  <3 RBC/hpf   Bacteria, UA RARE  RARE    Dg Chest Portable 1 View  12/01/2013   CLINICAL DATA:  Hematuria.  Loss of consciousness.  Fall.  EXAM: PORTABLE CHEST - 1 VIEW  COMPARISON:  12/07/2003  FINDINGS: The heart size and mediastinal contours are within normal limits. Both lungs are clear. The visualized skeletal structures are unremarkable.  IMPRESSION: No active disease.   Electronically Signed   By: Rolm Baptise M.D.    On: 12/01/2013 18:39    Review of Systems  Constitutional: Positive for weight loss and malaise/fatigue.  HENT: Negative.   Eyes: Negative.   Respiratory: Negative.   Cardiovascular: Negative.   Gastrointestinal: Negative.   Genitourinary: Positive for hematuria.  Musculoskeletal: Negative.   Skin: Negative.   Neurological: Positive for loss of consciousness.  Endo/Heme/Allergies: Negative.   Psychiatric/Behavioral: Negative.    Blood pressure 109/67, pulse 67, resp. rate 13, SpO2 100.00%. Physical Exam  Constitutional: He appears well-developed and well-nourished.  Pleasant, elderly, family at bedside  HENT:  Head: Normocephalic and atraumatic.  Eyes: EOM are normal. Pupils are equal, round, and reactive to light.  Neck: Normal range of motion. Neck supple.  Cardiovascular: Normal rate and regular rhythm.   Respiratory: Effort normal.  GI: Soft. Bowel sounds are normal.  Genitourinary: Penis normal.  Musculoskeletal: Normal range of motion.  Neurological: He is alert.  Skin: Skin is warm and dry.  Psychiatric: He has a normal mood and affect. His behavior is normal. Judgment and thought content normal.   PROCEDURE: Using aseptic technique a new 68F hematuria catheter was placed with 20cc sterile water in balloon and irrigated with 3000 cc NS to light pink, copious clots evacuated, then connected to NS irrigation at 2 gtt per second with efflux light pink.   Assessment/Plan: 1 - Hematuria, Urinary Retention - Suspect multifactorial from likely infection (+nitrites) and catheter irritation of massive friable prostate. GU malignancy certainly also possible. In acute setting recommend large bore hematuria catheter and continuous normal saline irrigation (placed and started in ER), empiric ABX. If does not improve or clots recur, will consider operative cysto / clot evacuation.    2 - Massive Prostatic Hypertrophy - Begin finasteride $RemoveBeforeDEI'5mg'[id number]$  daily, stop flomax (new syncope after  starting) and begin rapaflo (must less orthostasis risk). Will plan for repeat trial of void after hematuria clears.   3 - Recommend hostpitalist admission (was just DC'd from service last week) to help verify no additional causes for syncope.  4 - Will follow. Call with questions anytime.   Deryk Bozman 12/01/2013, 8:45 PM

## 2013-12-01 NOTE — ED Notes (Signed)
Dr. Tresa Moore irrigated pt cath with approx 3056ml Normal Saline.  Pt tolerated well.  Family remains at bedside.  Pt resting now without any complaints.

## 2013-12-01 NOTE — ED Notes (Addendum)
Attempted to irrigate catheter, able to flush 32mL of fluid into bladder, fluid cannot be pulled out, nor will fluid or urine drain when left to drain.

## 2013-12-01 NOTE — ED Notes (Signed)
Dr. Gasper Lloyd admitting MD at [facility].

## 2013-12-02 DIAGNOSIS — I959 Hypotension, unspecified: Secondary | ICD-10-CM | POA: Diagnosis present

## 2013-12-02 DIAGNOSIS — R319 Hematuria, unspecified: Secondary | ICD-10-CM | POA: Diagnosis present

## 2013-12-02 DIAGNOSIS — N390 Urinary tract infection, site not specified: Secondary | ICD-10-CM | POA: Diagnosis present

## 2013-12-02 DIAGNOSIS — N320 Bladder-neck obstruction: Secondary | ICD-10-CM | POA: Diagnosis present

## 2013-12-02 LAB — CBC
HCT: 23.9 % — ABNORMAL LOW (ref 39.0–52.0)
HEMATOCRIT: 23.6 % — AB (ref 39.0–52.0)
HEMOGLOBIN: 8.4 g/dL — AB (ref 13.0–17.0)
Hemoglobin: 8.5 g/dL — ABNORMAL LOW (ref 13.0–17.0)
MCH: 32.3 pg (ref 26.0–34.0)
MCH: 32.6 pg (ref 26.0–34.0)
MCHC: 35.6 g/dL (ref 30.0–36.0)
MCHC: 35.6 g/dL (ref 30.0–36.0)
MCV: 90.9 fL (ref 78.0–100.0)
MCV: 91.5 fL (ref 78.0–100.0)
PLATELETS: 340 10*3/uL (ref 150–400)
Platelets: 360 10*3/uL (ref 150–400)
RBC: 2.63 MIL/uL — ABNORMAL LOW (ref 4.22–5.81)
RBC: 2.58 MIL/uL — ABNORMAL LOW (ref 4.22–5.81)
RDW: 16.2 % — ABNORMAL HIGH (ref 11.5–15.5)
RDW: 16.1 % — ABNORMAL HIGH (ref 11.5–15.5)
WBC: 10.7 10*3/uL — AB (ref 4.0–10.5)
WBC: 12.9 10*3/uL — ABNORMAL HIGH (ref 4.0–10.5)

## 2013-12-02 LAB — BASIC METABOLIC PANEL
BUN: 16 mg/dL (ref 6–23)
CALCIUM: 8.3 mg/dL — AB (ref 8.4–10.5)
CHLORIDE: 96 meq/L (ref 96–112)
CO2: 21 meq/L (ref 19–32)
Creatinine, Ser: 0.61 mg/dL (ref 0.50–1.35)
GFR calc Af Amer: >90 mL/min (ref >90)
GFR calc non Af Amer: 87 mL/min — ABNORMAL LOW (ref >90)
Glucose, Bld: 103 mg/dL — ABNORMAL HIGH (ref 70–99)
Potassium: 4.1 mEq/L (ref 3.7–5.3)
SODIUM: 131 meq/L — AB (ref 137–147)

## 2013-12-02 MED ORDER — ACETAMINOPHEN 325 MG PO TABS
650.0000 mg | ORAL_TABLET | Freq: Four times a day (QID) | ORAL | Status: DC | PRN
Start: 2013-12-02 — End: 2013-12-08

## 2013-12-02 MED ORDER — ACETAMINOPHEN 650 MG RE SUPP: 650.0000 mg | Freq: Four times a day (QID) | RECTAL | Status: DC | PRN

## 2013-12-02 MED ORDER — ONDANSETRON HCL 4 MG/2ML IJ SOLN: 4.0000 mg | Freq: Four times a day (QID) | INTRAMUSCULAR | Status: DC | PRN

## 2013-12-02 MED ORDER — HYDROMORPHONE HCL PF 1 MG/ML IJ SOLN: 0.5000 mg | INTRAMUSCULAR | Status: DC | PRN

## 2013-12-02 MED ORDER — ONDANSETRON HCL 4 MG PO TABS
4.0000 mg | ORAL_TABLET | Freq: Four times a day (QID) | ORAL | Status: DC | PRN
Start: 2013-12-02 — End: 2013-12-08

## 2013-12-02 MED ORDER — OXYCODONE HCL 5 MG PO TABS
5.0000 mg | ORAL_TABLET | ORAL | Status: DC | PRN
  Administered 2013-12-02 – 2013-12-05 (×5): 5 mg via ORAL
  Filled 2013-12-02 (×5): qty 1

## 2013-12-02 MED ORDER — SODIUM CHLORIDE 0.9 % IV SOLN
INTRAVENOUS | Status: DC
  Administered 2013-12-02: via INTRAVENOUS

## 2013-12-02 MED ORDER — ZOLPIDEM TARTRATE 5 MG PO TABS: 5.0000 mg | ORAL_TABLET | Freq: Every evening | ORAL | Status: DC | PRN

## 2013-12-02 MED ORDER — ALUM & MAG HYDROXIDE-SIMETH 200-200-20 MG/5ML PO SUSP
30.0000 mL | Freq: Four times a day (QID) | ORAL | Status: DC | PRN
  Administered 2013-12-03: 30 mL via ORAL
  Filled 2013-12-02: qty 30

## 2013-12-02 NOTE — Progress Notes (Signed)
[facility] PROGRESS NOTE  Glenn Patterson DGU:[id number] DOB: 08/08/1923 DOA: 12/01/2013 PCP: No primary provider on file.  Assessment/Plan: 1. Syncope- Multifactorial, due to Orthostatic Hypotension, and Vaso-Vagal Response most likely. UTI may also have contributed.   - Resolved. Will continue IVF's  2. Hypotension - Continue IVFs. Hold Benazepril, and HCTZ.   3. Hematuria- due to Hemorrhagic Cystitis/+UTI, and/or Foley trauma from instrumentation, Seen By Urology Dr Tresa Moore in ED and new catheter placed and clot removed and bladder irrigation ordered by Urology.  - Continue to monitor cbc  4. Bladder Outlet Obstruction - caused by clot in bladder obstructing foley catheter, Urology removed clot and placed new foley.   5. UTI - Urine C+S sent , and IV Rocephin started. Adjust PRN Culture Results.   6. Hyponatremia - 131, discontinue HCTZ, continue Normal saline administration - Most likely due to poor oral solute intake.   7. Pancreatic Mass- S/P Biliary Stent placed with Elevated LFTs: is continuing with a GI workup as Outpatient , and to have Biopsy performed by [facility].   8. Urinary Retention- due to BPH- Foley placed by Urology, and started on Finasteride, And Flomax,was held due to hypotension.   9. Dyslipidemia- Continue Omega 3 Fatty Acids daily.   10. Macular Degeneration/ and Glaucoma- Continue Alphagan and Trusopt Ophthalmic Drops as directed.   11. DVT Prophylaxis with SCDs.   Code Status: full Family Communication: Discussed with patient and daughter at bedside Disposition Plan: Pending further recommendations from specialist on board.   Consultants:  Urology  Procedures:  Bladder irrigation  Antibiotics:  rocephin  HPI/Subjective: No new complaints. No acute issues overnight.  Objective: Filed Vitals:   12/02/13 1156  BP: 114/59  Pulse: 70  Temp: 97.3 F (36.3 C)  Resp: 18    Intake/Output Summary (Last 24 hours) at 12/02/13  1524 Last data filed at 12/02/13 1300  Gross per 24 hour  Intake   1155 ml  Output   3200 ml  Net  -2045 ml   Filed Weights   12/01/13 2359  Weight: 70.716 kg (155 lb 14.4 oz)    Exam:   General:  Pt in NAD, Alert and awake  Cardiovascular: RRR, no MRG  Respiratory: CTA BL, no wheezes  Abdomen: soft, NT, ND  Musculoskeletal: no cyanosis or clubbing  Data Reviewed: Basic Metabolic Panel:  Recent Labs Lab 11/27/13 0110 11/27/13 1235 11/27/13 1700 11/28/13 0715 12/01/13 1828 12/02/13 0401  NA 133* 131*  --  131* 130* 131*  K 3.6* 3.6*  --  3.7 3.8 4.1  CL 99 97  --  95* 92* 96  CO2 21 21  --  23 22 21   GLUCOSE 107* 118*  --  104* 125* 103*  BUN 11 9  --  9 14 16   CREATININE 0.55 0.54  --  0.53 0.66 0.61  CALCIUM 8.1* 8.4  --  8.5 9.1 8.3*  MG  --   --  1.8  --   --   --    Liver Function Tests:  Recent Labs Lab 11/27/13 0110 11/27/13 1235 11/28/13 0715 12/01/13 1828  AST 173* 162* 107* 131*  ALT 277* 270* 212* 206*  ALKPHOS 676* 759* 670* 649*  BILITOT 8.8* 7.8* 5.6* 4.2*  PROT 5.3* 5.9* 5.5* 6.7  ALBUMIN 2.4* 2.6* 2.6* 3.1*    Recent Labs Lab 11/28/13 0715  LIPASE 169*   No results found for this basename: AMMONIA,  in the last 168 hours CBC:  Recent Labs  Lab 11/27/13 0110 11/28/13 0715 11/30/13 0610 12/01/13 1828 12/02/13 0401  WBC 7.0 7.0 7.3 9.5 10.7*  NEUTROABS  --   --   --  6.8  --   HGB 10.0* 9.5* 9.2* 10.2* 8.5*  HCT 26.9* 26.0* 25.4* 28.4* 23.9*  MCV 87.6 88.4 89.4 90.4 90.9  PLT 252 262 312 394 340   Cardiac Enzymes:  Recent Labs Lab 12/01/13 1828  TROPONINI <0.30   BNP (last 3 results) No results found for this basename: PROBNP,  in the last 8760 hours CBG: No results found for this basename: GLUCAP,  in the last 168 hours  No results found for this or any previous visit (from the past 240 hour(s)).   Studies: Dg Chest Portable 1 View  12/01/2013   CLINICAL DATA:  Hematuria.  Loss of consciousness.  Fall.   EXAM: PORTABLE CHEST - 1 VIEW  COMPARISON:  12/07/2003  FINDINGS: The heart size and mediastinal contours are within normal limits. Both lungs are clear. The visualized skeletal structures are unremarkable.  IMPRESSION: No active disease.   Electronically Signed   By: Rolm Baptise M.D.   On: 12/01/2013 18:39    Scheduled Meds: . benazepril  10 mg Oral Daily  . brimonidine  1 drop Left Eye Daily  . calcium-vitamin D  1 tablet Oral Q breakfast  . cefTRIAXone (ROCEPHIN)  IV  1 g Intravenous Q24H  . cholecalciferol  1,000 Units Oral Daily  . dorzolamide  1 drop Both Eyes BID  . finasteride  5 mg Oral QHS  . multivitamin with minerals  1 tablet Oral Daily  . omega-3 acid ethyl esters  1 g Oral QHS  . silodosin  4 mg Oral Q breakfast   Continuous Infusions: . sodium chloride 75 mL/hr at 12/02/13 0020    Principal Problem:   Syncope Active Problems:   Hyponatremia   Pancreatic mass   Urinary retention   Dyslipidemia   Macular degeneration   Hypotension, unspecified   Hematuria   Bladder outlet obstruction   Urinary tract infection, site not specified    Time spent: > 35 minutes    Velvet Bathe  [facility] Pager [phone] If 7PM-7AM, please contact night-coverage at [email or web address], password [facility] 12/02/2013, 3:24 PM  LOS: 1 day

## 2013-12-02 NOTE — H&P (Signed)
[facility] History and Physical  Glenn Patterson [id number] DOB: [birth year] DOA: 12/01/2013  Referring physician:  PCP: No primary provider on file.  Specialists:  Dr. Tresa Moore ( Urology)  Chief Complaint: Passed Out  HPI: Glenn Patterson is a 80 y.o. male with a recently diagnosed pancreatic mass found during a hospitalization from 02/    until 02/065/2015 for Obstructive Jaundice and Urinary Retention who returns to the ED after a syncopal event this evening when he was trying to pass his urine.   He had a foley catheter placed while he was hospitialised due to urinary retention, and while he was home he began to have hematuria overnight and into the day, then he was not able to pass any urine into the catheter and he was becoming increasing uncomfortable by the afternoon.   He went into the bathroom and tried to sit on the toilet to see if that would help him pass his urine through the catheter but the urine still would not pass.   He then reached out for the towel rack to hold on to as he stood up and he passed out.  When he came to, he was incoherent and confused and did not recognize his family for a few minutes and they subsequently called EMS.       In the ED, Urology was called to evaluate the patient and to remove and replace the foley catheter.  He was seen by Dr. Tresa Moore of Urology and large clots were removed and a new foley catheter was placed and he was started on ca continuous bladder irrigation.  A urinalysis was performed which revealed a UTI as well and he was placed on IV Rocephin and referred for medical admission..         Review of Systems:  Constitutional: No Weight loss, Night Sweats, Fevers, Chills, Fatigue or +Generalized Weakness HEENT: No headaches, Difficulty swallowing,Tooth/dental problems,Sore throat,  No sneezing, itching, ear ache, nasal congestion, post nasal drip,  Cardio-vascular:  No Chest Pain, Orthopnea, PND, Edema in lower extremities, Anasarca,  Dizziness, Palpitations  Resp: No shortness of breath, DOE. No cough, No hemoptysis, No wheezing.  No chest wall deformity GI: No heartburn, indigestion, abdominal pain, nausea, vomiting, diarrhea, change in bowel habits, loss of appetite  GU: No dysuria, +HEMATURIA, +URGENCY,  No flank pain.  Musculoskeletal: No joint pain or swelling. No decreased range of motion. No back pain.  Neurologic: +SYNCOPE, No Seizures, Muscle Weakness, Paresthesia, Vision disturbance or Loss, No Diplopia, No Vertigo, No Difficulty Walking,  Skin: no rash or lesions. Psych: No change in mood or affect. No depression or anxiety. No memory loss. No confusion or hallucinations   Past Medical History  Diagnosis Date  . Hypertension   . Hypercholesteremia   . Pancreatic mass   . Glaucoma   . Macular degeneration      Past Surgical History  Procedure Laterality Date  . Ercp N/A 11/26/2013    Procedure: ENDOSCOPIC RETROGRADE CHOLANGIOPANCREATOGRAPHY (ERCP);  Surgeon: Beryle Beams, MD;  Location: [facility] ENDOSCOPY;  Service: Endoscopy;  Laterality: N/A;    Prior to Admission medications   Medication Sig Start Date End Date Taking? Authorizing Provider  aspirin 81 MG tablet Take 81 mg by mouth daily.   Yes Historical Provider, MD  benazepril (LOTENSIN) 10 MG tablet Take 10 mg by mouth daily.   Yes Historical Provider, MD  brimonidine (ALPHAGAN P) 0.1 % SOLN Place 1 drop into the left eye daily.   Yes Historical  Provider, MD  calcium citrate-vitamin D (CITRACAL+D) 315-200 MG-UNIT per tablet Take 1 tablet by mouth daily.   Yes Historical Provider, MD  Cholecalciferol (VITAMIN D-3) 1000 UNITS CAPS Take 1,000 Units by mouth daily.    Yes Historical Provider, MD  dorzolamide (TRUSOPT) 2 % ophthalmic solution Place 1 drop into both eyes 2 (two) times daily.   Yes Historical Provider, MD  hydrochlorothiazide (MICROZIDE) 12.5 MG capsule Take 1 capsule (12.5 mg total) by mouth daily. 11/30/13  Yes Hosie Poisson, MD  Multiple  Vitamin (MULTIVITAMIN WITH MINERALS) TABS tablet Take 1 tablet by mouth daily.   Yes Historical Provider, MD  Omega-3 Fatty Acids (OMEGA 3 PO) Take 1 capsule by mouth at bedtime.   Yes Historical Provider, MD  OVER THE COUNTER MEDICATION Take 1 tablet by mouth 2 (two) times daily. Macular Degeneration supplement   Yes Historical Provider, MD  tamsulosin (FLOMAX) 0.4 MG CAPS capsule Take 1 capsule (0.4 mg total) by mouth 2 (two) times daily. 11/30/13  Yes Hosie Poisson, MD     No Known Allergies   Social History:  reports that he has never smoked. He has never used smokeless tobacco. He reports that he drinks about 4.2 ounces of alcohol per week. He reports that he does not use illicit drugs.     History reviewed. No pertinent family history.     Physical Exam:  GEN:  Pleasant Elderly Well Nourished and Well Developed  80 y.o. Caucasian male  examined  and in no acute distress; cooperative with exam Filed Vitals:   12/01/13 2253 12/01/13 2300 12/01/13 2315 12/01/13 2359  BP: 136/62 114/61 115/47 124/57  Pulse:  66 80 91  Temp:    97.6 F (36.4 C)  TempSrc:    Oral  Resp: 19 12 20 18   Height:    5\' 8"  (1.727 m)  Weight:    70.716 kg (155 lb 14.4 oz)  SpO2: 100% 100% 100% 100%   Blood pressure 124/57, pulse 91, temperature 97.6 F (36.4 C), temperature source Oral, resp. rate 18, height 5\' 8"  (1.727 m), weight 70.716 kg (155 lb 14.4 oz), SpO2 100.00%. PSYCH: He is alert and oriented x4; does not appear anxious does not appear depressed; affect is normal HEENT: Normocephalic and Atraumatic, Mucous membranes pink; PERRLA; EOM intact; Fundi:  Benign;  No scleral icterus, Nares: Patent, Oropharynx: Clear, Fair Dentition, Neck:  FROM, no cervical lymphadenopathy nor thyromegaly or carotid bruit; no JVD; Breasts:: Not examined CHEST WALL: No tenderness CHEST: Normal respiration, clear to auscultation bilaterally HEART: Regular rate and rhythm; no murmurs rubs or gallops BACK: No kyphosis  or scoliosis; no CVA tenderness ABDOMEN: Positive Bowel Sounds, soft non-tender; no masses, no organomegaly. Rectal Exam: Not done EXTREMITIES: No cyanosis, clubbing or edema; no ulcerations. Genitalia: not examined PULSES: 2+ and symmetric SKIN: Normal hydration no rash or ulceration CNS: Alert and Oriented x 4, Speech Clear, Cranial Nerves Intact, Sensory and Motor Function Intact, Gait deferred.    Vascular: pulses palpable throughout    Labs on Admission:  Basic Metabolic Panel:  Recent Labs Lab 11/25/13 1115 11/27/13 0110 11/27/13 1235 11/27/13 1700 11/28/13 0715 12/01/13 1828  NA 128* 133* 131*  --  131* 130*  K 3.9 3.6* 3.6*  --  3.7 3.8  CL 94* 99 97  --  95* 92*  CO2 19 21 21   --  23 22  GLUCOSE 138* 107* 118*  --  104* 125*  BUN 17 11 9   --  9 14  CREATININE 0.55 0.55 0.54  --  0.53 0.66  CALCIUM 8.6 8.1* 8.4  --  8.5 9.1  MG  --   --   --  1.8  --   --    Liver Function Tests:  Recent Labs Lab 11/25/13 1115 11/27/13 0110 11/27/13 1235 11/28/13 0715 12/01/13 1828  AST 235* 173* 162* 107* 131*  ALT 367* 277* 270* 212* 206*  ALKPHOS 787* 676* 759* 670* 649*  BILITOT 11.9* 8.8* 7.8* 5.6* 4.2*  PROT 6.5 5.3* 5.9* 5.5* 6.7  ALBUMIN 3.0* 2.4* 2.6* 2.6* 3.1*    Recent Labs Lab 11/28/13 0715  LIPASE 169*   No results found for this basename: AMMONIA,  in the last 168 hours CBC:  Recent Labs Lab 11/25/13 1115 11/27/13 0110 11/28/13 0715 11/30/13 0610 12/01/13 1828  WBC 8.3 7.0 7.0 7.3 9.5  NEUTROABS  --   --   --   --  6.8  HGB 11.6* 10.0* 9.5* 9.2* 10.2*  HCT 31.4* 26.9* 26.0* 25.4* 28.4*  MCV 88.0 87.6 88.4 89.4 90.4  PLT 269 252 262 312 394   Cardiac Enzymes:  Recent Labs Lab 12/01/13 1828  TROPONINI <0.30    BNP (last 3 results) No results found for this basename: PROBNP,  in the last 8760 hours CBG: No results found for this basename: GLUCAP,  in the last 168 hours  Radiological Exams on Admission: Dg Chest Portable 1  View  12/01/2013   CLINICAL DATA:  Hematuria.  Loss of consciousness.  Fall.  EXAM: PORTABLE CHEST - 1 VIEW  COMPARISON:  12/07/2003  FINDINGS: The heart size and mediastinal contours are within normal limits. Both lungs are clear. The visualized skeletal structures are unremarkable.  IMPRESSION: No active disease.   Electronically Signed   By: Rolm Baptise M.D.   On: 12/01/2013 18:39     EKG: Independently reviewed.     Assessment/Plan:   80 y.o. male with  Principal Problem:   Syncope Active Problems:   Hypotension, unspecified   Hematuria   Bladder outlet obstruction   Urinary tract infection, site not specified   Hyponatremia   Pancreatic mass   Urinary retention   Dyslipidemia   Macular degeneration   Glaucoma     1.  Syncope- Multifactorial, due to Orthostatic Hypotension, and Vaso-Vagal Response while trying to Urinate but was obstructed,  Also due to Early Sepsis/UTI.  Monitor on Telemetry to rule out cardiogenic causes, and IVFs for rehydration, and foley is now patent.    2.  Hypotension-  IVFs to gently rehydrate.  Hold Benazepril, and HCTZ.    3.  Hematuria- due to Hemorrhagic Cystitis/+UTI, and/or Foley trauma from instrumentation,  Seen By Urology Dr Tresa Moore in ED and new catheter placed and clot removed and bladder irrigation ordered by Urology.    4.  Bladder Outlet Obstruction- caused by clot in bladder obstructing foley catheter,  Urology removed clot and placed new foley.    5.  UTI- Urine C+S sent , and IV Rocephin started.  Adjust PRN Culture Results.    6.  Hyponatremia-  130 on admission, discontinue HCTZ,  Monitor Na+ Levels.    7.  Pancreatic Mass- S/P Biliary Stent placed with Elevated LFTs:  is continuing with a GI workup as Outpatient , and to have Biopsy performed by [facility].    8.  Urinary Retention- due to BPH-  Foley placed by Urology, and started on Finasteride,  And Flomax,was held due to hypotension.  9.  Dyslipidemia-  Continue Omega 3  Fatty Acids daily.    10.  Macular Degeneration/ and Glaucoma-  Continue Alphagan and Trusopt Ophthalmic Drops as directed.    11.  DVT Prophylaxis with SCDs.       Code Status:   FULL CODE Family Communication:    Wife and Daughter at bedside Disposition Plan:   Inpatient    Time spent:  [facility] Pager [phone]  If 7PM-7AM, please contact night-coverage [email or web address] Password [facility] 12/02/2013, 12:58 AM

## 2013-12-02 NOTE — Progress Notes (Signed)
Utilization Review completed.  

## 2013-12-02 NOTE — Progress Notes (Signed)
[facility]  Patient Status: New  [facility] is providing the following services: RN - Patient was to be seen for Start of Care on 12/01/13 but was readmitted prior to the system.  If patient discharges after hours, please call [phone].   Glenn Patterson 12/02/2013, 10:40 AM

## 2013-12-02 NOTE — Progress Notes (Signed)
Subjective:  1 - Hematuria, Urinary Retention - Pt with new hematuria culminating with frank clots and retention 12/01/13 at home. Recently DC'd from hospital stay during which stomach and likely pancreatic cancer discovered and pt developed new urinary retention following GI procedure and failed trial of void x several. Only current blood thinner ASA. No prior episodes. CT 10/2013 w/o large GU mass, but does reveal massive prostate. UA  with +nitrites, UCX pending, now on empiric rocepin.  2 - Massive Prostatic Hypertrophy - 250gm prostate by imaging. Minimal baseline voiding smptoms prior to recent admission. Started finasteride this admission to help reduce prostatic vascularitiy.  Today Culley is seen in f/u above. No events since admission yesterday. He continues to c/o pruitis, no more SP pain. No issues with bladder irrigation today.    Objective: Vital signs in last 24 hours: Temp:  [97.3 F (36.3 C)-98.2 F (36.8 C)] 98.2 F (36.8 C) (02/07 2105) Pulse Rate:  [29-115] 76 (02/07 2105) Resp:  [12-23] 18 (02/07 2105) BP: (109-136)/(41-69) 132/53 mmHg (02/07 2105) SpO2:  [98 %-100 %] 98 % (02/07 2105) Weight:  [70.716 kg (155 lb 14.4 oz)] 70.716 kg (155 lb 14.4 oz) (02/06 2359) Last BM Date: 12/01/13  Intake/Output from previous day: 02/06 0701 - 02/07 0700 In: 475 [I.V.:425; IV Piggyback:50] Out: 3200 [Urine:3200] Intake/Output this shift: Total I/O In: 50 [IV Piggyback:50] Out: 1700 [Urine:1700]  General appearance: alert, cooperative, appears stated age and family at bedside Head: Normocephalic, without obvious abnormality, atraumatic Eyes: icteric Ears: normal TM's and external ear canals both ears Nose: Nares normal. Septum midline. Mucosa normal. No drainage or sinus tenderness. Throat: lips, mucosa, and tongue normal; teeth and gums normal Neck: no adenopathy, no carotid bruit, no JVD, supple, symmetrical, trachea midline and thyroid not enlarged, symmetric, no  tenderness/mass/nodules Back: symmetric, no curvature. ROM normal. No CVA tenderness. Resp: clear to auscultation bilaterally Chest wall: no tenderness GI: soft, non-tender; bowel sounds normal; no masses,  no organomegaly Male genitalia: normal, 3 way catheter c/d/i, efflux pink on CBI 1 gtt per second.  Extremities: extremities normal, atraumatic, no cyanosis or edema Skin: Skin color, texture, turgor normal. No rashes or lesions or jaundiced Lymph nodes: Cervical, supraclavicular, and axillary nodes normal. Neurologic: Grossly normal  Lab Results:   Recent Labs  12/02/13 0401 12/02/13 1530  WBC 10.7* 12.9*  HGB 8.5* 8.4*  HCT 23.9* 23.6*  PLT 340 360   BMET  Recent Labs  12/01/13 1828 12/02/13 0401  NA 130* 131*  K 3.8 4.1  CL 92* 96  CO2 22 21  GLUCOSE 125* 103*  BUN 14 16  CREATININE 0.66 0.61  CALCIUM 9.1 8.3*   PT/INR  Recent Labs  12/01/13 1828  LABPROT 12.8  INR 0.98   ABG No results found for this basename: PHART, PCO2, PO2, HCO3,  in the last 72 hours  Studies/Results: Dg Chest Portable 1 View  12/01/2013   CLINICAL DATA:  Hematuria.  Loss of consciousness.  Fall.  EXAM: PORTABLE CHEST - 1 VIEW  COMPARISON:  12/07/2003  FINDINGS: The heart size and mediastinal contours are within normal limits. Both lungs are clear. The visualized skeletal structures are unremarkable.  IMPRESSION: No active disease.   Electronically Signed   By: Rolm Baptise M.D.   On: 12/01/2013 18:39    Anti-infectives: Anti-infectives   Start     Dose/Rate Route Frequency Ordered Stop   12/02/13 2000  cefTRIAXone (ROCEPHIN) 1 g in dextrose 5 % 50 mL IVPB  1 g 100 mL/hr over 30 Minutes Intravenous Every 24 hours 12/01/13 2215     12/01/13 2130  cefTRIAXone (ROCEPHIN) 1 g in dextrose 5 % 50 mL IVPB     1 g 100 mL/hr over 30 Minutes Intravenous  Once 12/01/13 2130 12/01/13 2332      Assessment/Plan:  1 - Hematuria, Urinary Retention - likely from large prostate, now  stable on bladder irrigation. If does not improve significantly over next day or so, would consider operative cysto / fulguration / channel TURP  2 - Massive Prostatic Hypertrophy - continue finasteride.  WIll follow  [facility], Cate Oravec 12/02/2013

## 2013-12-03 LAB — CBC
HCT: 21.9 % — ABNORMAL LOW (ref 39.0–52.0)
HEMOGLOBIN: 7.6 g/dL — AB (ref 13.0–17.0)
MCH: 32.2 pg (ref 26.0–34.0)
MCHC: 34.7 g/dL (ref 30.0–36.0)
MCV: 92.8 fL (ref 78.0–100.0)
PLATELETS: 354 10*3/uL (ref 150–400)
RBC: 2.36 MIL/uL — AB (ref 4.22–5.81)
RDW: 16.5 % — ABNORMAL HIGH (ref 11.5–15.5)
WBC: 13.1 10*3/uL — ABNORMAL HIGH (ref 4.0–10.5)

## 2013-12-03 LAB — PREPARE RBC (CROSSMATCH)

## 2013-12-03 NOTE — Progress Notes (Signed)
[facility] PROGRESS NOTE  Glenn Patterson OJJ:[id number] DOB: 15-Jul-1923 DOA: 12/01/2013 PCP: No primary provider on file.  Assessment/Plan: 1. Syncope- Multifactorial, due to Orthostatic Hypotension, and Vaso-Vagal Response most likely. UTI may also have contributed.   - Resolved. Patient given IV fluids initially. Currently tolerating by mouth intake as such will discontinue IV fluid.  2. Hypotension - Resolved, Holding Benazepril, and HCTZ.   3. Hematuria- due to Hemorrhagic Cystitis/+UTI, and/or Foley trauma from instrumentation, Seen By Urology Dr Tresa Moore in ED and new catheter placed and clot removed and bladder irrigation ordered by Urology.  - Continue to monitor cbc - Given worsening anemia and hemoglobin levels less than 7.8 we'll plan on transfusing 1 unit of packed red blood cells. Discussed with patient who was agreeable  4. Bladder Outlet Obstruction - caused by clot in bladder obstructing foley catheter, Urology removed clot and placed new foley.   5. UTI - Urine C+S sent , and IV Rocephin started. Culture Results pending.   6. Hyponatremia - 131, discontinue HCTZ, continue Normal saline administration - Most likely due to poor oral solute intake.   7. Pancreatic Mass- S/P Biliary Stent placed with Elevated LFTs: is continuing with a GI workup as Outpatient , and to have Biopsy performed by [facility].   8. Urinary Retention- due to BPH- Foley placed by Urology, and started on Finasteride, And Flomax,was held due to hypotension.   9. Dyslipidemia- Continue Omega 3 Fatty Acids daily.   10. Macular Degeneration/ and Glaucoma- Continue Alphagan and Trusopt Ophthalmic Drops as directed.   11. DVT Prophylaxis with SCDs.   Code Status: full Family Communication: Discussed with patient and daughter at bedside Disposition Plan: Pending further recommendations from specialist on board.   Consultants:  Urology  Procedures:  Bladder  irrigation  Antibiotics:  rocephin  HPI/Subjective: No new complaints. No acute issues overnight.  Objective: Filed Vitals:   12/03/13 0815  BP: 136/59  Pulse: 84  Temp: 97.7 F (36.5 C)  Resp:     Intake/Output Summary (Last 24 hours) at 12/03/13 1211 Last data filed at 12/03/13 0900  Gross per 24 hour  Intake   1245 ml  Output   7400 ml  Net  -6155 ml   Filed Weights   12/01/13 2359  Weight: 70.716 kg (155 lb 14.4 oz)    Exam:   General:  Pt in NAD, Alert and awake  Cardiovascular: RRR, no MRG  Respiratory: CTA BL, no wheezes  Abdomen: soft, NT, ND  Musculoskeletal: no cyanosis or clubbing  Data Reviewed: Basic Metabolic Panel:  Recent Labs Lab 11/27/13 0110 11/27/13 1235 11/27/13 1700 11/28/13 0715 12/01/13 1828 12/02/13 0401  NA 133* 131*  --  131* 130* 131*  K 3.6* 3.6*  --  3.7 3.8 4.1  CL 99 97  --  95* 92* 96  CO2 21 21  --  23 22 21   GLUCOSE 107* 118*  --  104* 125* 103*  BUN 11 9  --  9 14 16   CREATININE 0.55 0.54  --  0.53 0.66 0.61  CALCIUM 8.1* 8.4  --  8.5 9.1 8.3*  MG  --   --  1.8  --   --   --    Liver Function Tests:  Recent Labs Lab 11/27/13 0110 11/27/13 1235 11/28/13 0715 12/01/13 1828  AST 173* 162* 107* 131*  ALT 277* 270* 212* 206*  ALKPHOS 676* 759* 670* 649*  BILITOT 8.8* 7.8* 5.6* 4.2*  PROT 5.3* 5.9*  5.5* 6.7  ALBUMIN 2.4* 2.6* 2.6* 3.1*    Recent Labs Lab 11/28/13 0715  LIPASE 169*   No results found for this basename: AMMONIA,  in the last 168 hours CBC:  Recent Labs Lab 11/30/13 0610 12/01/13 1828 12/02/13 0401 12/02/13 1530 12/03/13 0505  WBC 7.3 9.5 10.7* 12.9* 13.1*  NEUTROABS  --  6.8  --   --   --   HGB 9.2* 10.2* 8.5* 8.4* 7.6*  HCT 25.4* 28.4* 23.9* 23.6* 21.9*  MCV 89.4 90.4 90.9 91.5 92.8  PLT 312 394 340 360 354   Cardiac Enzymes:  Recent Labs Lab 12/01/13 1828  TROPONINI <0.30   BNP (last 3 results) No results found for this basename: PROBNP,  in the last 8760  hours CBG: No results found for this basename: GLUCAP,  in the last 168 hours  Recent Results (from the past 240 hour(s))  URINE CULTURE     Status: None   Collection Time    12/01/13  8:16 PM      Result Value Range Status   Specimen Description URINE, CATHETERIZED   Final   Special Requests NONE   Final   Culture  Setup Time     Final   Value: 12/01/2013 02:10     Performed at [place] PENDING   Incomplete   Culture     Final   Value: Culture reincubated for better growth     Performed at [facility]   Report Status PENDING   Incomplete     Studies: Dg Chest Portable 1 View  12/01/2013   CLINICAL DATA:  Hematuria.  Loss of consciousness.  Fall.  EXAM: PORTABLE CHEST - 1 VIEW  COMPARISON:  12/07/2003  FINDINGS: The heart size and mediastinal contours are within normal limits. Both lungs are clear. The visualized skeletal structures are unremarkable.  IMPRESSION: No active disease.   Electronically Signed   By: Rolm Baptise M.D.   On: 12/01/2013 18:39    Scheduled Meds: . brimonidine  1 drop Left Eye Daily  . calcium-vitamin D  1 tablet Oral Q breakfast  . cefTRIAXone (ROCEPHIN)  IV  1 g Intravenous Q24H  . cholecalciferol  1,000 Units Oral Daily  . dorzolamide  1 drop Both Eyes BID  . finasteride  5 mg Oral QHS  . multivitamin with minerals  1 tablet Oral Daily  . omega-3 acid ethyl esters  1 g Oral QHS  . silodosin  4 mg Oral Q breakfast   Continuous Infusions: . sodium chloride 75 mL/hr at 12/02/13 0020    Principal Problem:   Syncope Active Problems:   Hyponatremia   Pancreatic mass   Urinary retention   Dyslipidemia   Macular degeneration   Hypotension, unspecified   Hematuria   Bladder outlet obstruction   Urinary tract infection, site not specified    Time spent: > 35 minutes    Velvet Bathe  [facility] Pager [phone] If 7PM-7AM, please contact night-coverage at [email or web address], password [facility] 12/03/2013,  12:11 PM  LOS: 2 days

## 2013-12-03 NOTE — Progress Notes (Signed)
Subjective:  1 - Hematuria, Urinary Retention - Pt with new hematuria culminating with frank clots and retention 12/01/13 at home. Recently DC'd from hospital stay during which stomach and likely pancreatic cancer discovered and pt developed new urinary retention following GI procedure and failed trial of void x several. Only current blood thinner ASA. No prior episodes. CT 10/2013 w/o large GU mass, but does reveal massive prostate. UA  with +nitrites, UCX pending, now on empiric rocepin and slow bladder irrigation. Hgb down to 7.6 2/8 and being transfused.   2 - Massive Prostatic Hypertrophy - 250gm prostate by imaging. Minimal baseline voiding smptoms prior to recent admission. Started finasteride this admission to help reduce prostatic vascularitiy.  Today Glenn Patterson is seen in f/u above. Receiving RBC's today due to slow but persistent Hgb drift.   Objective: Vital signs in last 24 hours: Temp:  [96.3 F (35.7 C)-98.2 F (36.8 C)] 97.6 F (36.4 C) (02/08 1500) Pulse Rate:  [66-94] 72 (02/08 1500) Resp:  [15-18] 16 (02/08 1500) BP: (112-139)/(48-64) 135/55 mmHg (02/08 1500) SpO2:  [98 %-100 %] 100 % (02/08 1500) Last BM Date: 12/01/13  Intake/Output from previous day: 02/07 0701 - 02/08 0700 In: 1265 [P.O.:680; I.V.:535; IV Piggyback:50] Out: 9000 [Urine:9000] Intake/Output this shift: Total I/O In: 582.5 [P.O.:320; I.V.:250; Blood:12.5] Out: 4400 [Urine:4400]  General appearance: alert, cooperative, appears stated age and family and church friends at bedside Head: Normocephalic, without obvious abnormality, atraumatic Eyes: conjunctivae/corneas clear. PERRL, EOM's intact. Fundi benign. Ears: normal TM's and external ear canals both ears Nose: Nares normal. Septum midline. Mucosa normal. No drainage or sinus tenderness. Throat: lips, mucosa, and tongue normal; teeth and gums normal Neck: no adenopathy, no carotid bruit, no JVD, supple, symmetrical, trachea midline and thyroid not  enlarged, symmetric, no tenderness/mass/nodules Back: symmetric, no curvature. ROM normal. No CVA tenderness. Resp: clear to auscultation bilaterally Chest wall: no tenderness Cardio: regular rate and rhythm, S1, S2 normal, no murmur, click, rub or gallop GI: soft, non-tender; bowel sounds normal; no masses,  no organomegaly Male genitalia: normal, Foley c/d/i, wtill with farily dark urine on slow CBI. NO clots.  Extremities: extremities normal, atraumatic, no cyanosis or edema Pulses: 2+ and symmetric Skin: Skin color, texture, turgor normal. No rashes or lesions or Much less incterus today Lymph nodes: Cervical, supraclavicular, and axillary nodes normal. Neurologic: Grossly normal  Lab Results:   Recent Labs  12/02/13 1530 12/03/13 0505  WBC 12.9* 13.1*  HGB 8.4* 7.6*  HCT 23.6* 21.9*  PLT 360 354   BMET  Recent Labs  12/01/13 1828 12/02/13 0401  NA 130* 131*  K 3.8 4.1  CL 92* 96  CO2 22 21  GLUCOSE 125* 103*  BUN 14 16  CREATININE 0.66 0.61  CALCIUM 9.1 8.3*   PT/INR  Recent Labs  12/01/13 1828  LABPROT 12.8  INR 0.98   ABG No results found for this basename: PHART, PCO2, PO2, HCO3,  in the last 72 hours  Studies/Results: Dg Chest Portable 1 View  12/01/2013   CLINICAL DATA:  Hematuria.  Loss of consciousness.  Fall.  EXAM: PORTABLE CHEST - 1 VIEW  COMPARISON:  12/07/2003  FINDINGS: The heart size and mediastinal contours are within normal limits. Both lungs are clear. The visualized skeletal structures are unremarkable.  IMPRESSION: No active disease.   Electronically Signed   By: Rolm Baptise M.D.   On: 12/01/2013 18:39    Anti-infectives: Anti-infectives   Start     Dose/Rate Route Frequency Ordered Stop  12/02/13 2000  cefTRIAXone (ROCEPHIN) 1 g in dextrose 5 % 50 mL IVPB     1 g 100 mL/hr over 30 Minutes Intravenous Every 24 hours 12/01/13 2215     12/01/13 2130  cefTRIAXone (ROCEPHIN) 1 g in dextrose 5 % 50 mL IVPB     1 g 100 mL/hr over 30  Minutes Intravenous  Once 12/01/13 2130 12/01/13 2332      Assessment/Plan:  1 - Hematuria, Urinary Retention - likely from large prostate, now stable on bladder irrigation, but not resolved. UCX pending, agree with emperic ABX for now as well as transfustion today. If hgb remains drifting downward and if gross blood not clearing would proceed with cysto / clot evacuation / fulgeration / channel TURP likely on [place] at [place]. Will make final determination next 24-48 hours.  2 - Massive Prostatic Hypertrophy - continue finasteride, this will take some time for effect.  WIll follow, greatly appreciate hospitalist help.   [facility], Deaunna Olarte 12/03/2013

## 2013-12-03 NOTE — ED Provider Notes (Signed)
I saw and evaluated the patient, reviewed the resident's note and I agree with the findings and plan.   .Face to face Exam:  General:  Awake HEENT:  Atraumatic Resp:  Normal effort Abd:  Nondistended Neuro:No focal weakness Lymph: No adenopath  EKG reviewed and discussed with resident  Dot Lanes, MD 12/03/13 1044

## 2013-12-04 LAB — URINE CULTURE: Colony Count: 9000

## 2013-12-04 LAB — CBC
HEMATOCRIT: 24.2 % — AB (ref 39.0–52.0)
HEMOGLOBIN: 8.5 g/dL — AB (ref 13.0–17.0)
MCH: 31.8 pg (ref 26.0–34.0)
MCHC: 35.1 g/dL (ref 30.0–36.0)
MCV: 90.6 fL (ref 78.0–100.0)
Platelets: 331 10*3/uL (ref 150–400)
RBC: 2.67 MIL/uL — ABNORMAL LOW (ref 4.22–5.81)
RDW: 18.2 % — ABNORMAL HIGH (ref 11.5–15.5)
WBC: 8.3 10*3/uL (ref 4.0–10.5)

## 2013-12-04 LAB — TYPE AND SCREEN
ABO/RH(D): A POS
Antibody Screen: NEGATIVE
Unit division: 0

## 2013-12-04 MED ORDER — VANCOMYCIN HCL 10 G IV SOLR
1250.0000 mg | INTRAVENOUS | Status: DC
  Administered 2013-12-04 – 2013-12-05 (×2): 1250 mg via INTRAVENOUS
  Filled 2013-12-04 (×3): qty 1250

## 2013-12-04 NOTE — Progress Notes (Signed)
[facility] PROGRESS NOTE  Glenn Patterson [id number]:[id number] DOB: September 02, 1923 DOA: 12/01/2013 PCP: No primary provider on file.  Assessment/Plan: 1. Syncope- Multifactorial, due to Orthostatic Hypotension, and Vaso-Vagal Response most likely. UTI may also have contributed.   - Resolved.  2. Hypotension - Resolved, Holding Benazepril, and HCTZ.  - Blood pressures given age well controlled off of antihypertensive medication.  3. Hematuria- due to Hemorrhagic Cystitis/+UTI, and/or Foley trauma from instrumentation, Seen By Urology Dr Tresa Moore in ED and new catheter placed and clot removed and bladder irrigation ordered by Urology.  - Continue to monitor cbc - Should hemoglobin dip below 7.9 with active bleeding will plan on transfusing again. - Urology considering cystoscopy should this problem continue.  4. Bladder Outlet Obstruction - caused by clot in bladder obstructing foley catheter, Urology removed clot and placed new foley. Currently undergoing bladder irrigation.  5. UTI - Urine C+S reviewed and patient will be placed on Vancomycin at this point.  Rocephin discontinued. No good oral options at this point.  6. Hyponatremia - 131, discontinue HCTZ, continue Normal saline administration - Most likely due to poor oral solute intake.   7. Pancreatic Mass- S/P Biliary Stent placed with Elevated LFTs: is continuing with a GI workup as Outpatient , and to have Biopsy performed by [facility].   8. Urinary Retention- due to BPH- Foley placed by Urology, and started on Finasteride. - Management per urology  9. Dyslipidemia- Continue Omega 3 Fatty Acids daily.   10. Macular Degeneration/ and Glaucoma- Continue Alphagan and Trusopt Ophthalmic Drops as directed.   11. DVT Prophylaxis with SCDs.   Code Status: full Family Communication: Discussed with wife at bedside. Disposition Plan: Pending cessation of hematuria. Urology on  board.   Consultants:  Urology  Procedures:  Bladder irrigation  Antibiotics:  rocephin  HPI/Subjective: No new complaints. No acute issues overnight.  Objective: Filed Vitals:   12/04/13 1233  BP: 142/61  Pulse: 70  Temp: 97.8 F (36.6 C)  Resp: 18    Intake/Output Summary (Last 24 hours) at 12/04/13 1451 Last data filed at 12/04/13 1400  Gross per 24 hour  Intake   1200 ml  Output   6650 ml  Net  -5450 ml   Filed Weights   12/01/13 2359  Weight: 70.716 kg (155 lb 14.4 oz)    Exam:   General:  Pt in NAD, Alert and awake  Cardiovascular: RRR, no MRG  Respiratory: CTA BL, no wheezes  Abdomen: soft, NT, ND  Musculoskeletal: no cyanosis or clubbing  Data Reviewed: Basic Metabolic Panel:  Recent Labs Lab 11/27/13 1700 11/28/13 0715 12/01/13 1828 12/02/13 0401  NA  --  131* 130* 131*  K  --  3.7 3.8 4.1  CL  --  95* 92* 96  CO2  --  23 22 21   GLUCOSE  --  104* 125* 103*  BUN  --  9 14 16   CREATININE  --  0.53 0.66 0.61  CALCIUM  --  8.5 9.1 8.3*  MG 1.8  --   --   --    Liver Function Tests:  Recent Labs Lab 11/28/13 0715 12/01/13 1828  AST 107* 131*  ALT 212* 206*  ALKPHOS 670* 649*  BILITOT 5.6* 4.2*  PROT 5.5* 6.7  ALBUMIN 2.6* 3.1*    Recent Labs Lab 11/28/13 0715  LIPASE 169*   No results found for this basename: AMMONIA,  in the last 168 hours CBC:  Recent Labs Lab 12/01/13 1828 12/02/13 0401  12/02/13 1530 12/03/13 0505 12/04/13 1220  WBC 9.5 10.7* 12.9* 13.1* 8.3  NEUTROABS 6.8  --   --   --   --   HGB 10.2* 8.5* 8.4* 7.6* 8.5*  HCT 28.4* 23.9* 23.6* 21.9* 24.2*  MCV 90.4 90.9 91.5 92.8 90.6  PLT 394 340 360 354 331   Cardiac Enzymes:  Recent Labs Lab 12/01/13 1828  TROPONINI <0.30   BNP (last 3 results) No results found for this basename: PROBNP,  in the last 8760 hours CBG: No results found for this basename: GLUCAP,  in the last 168 hours  Recent Results (from the past 240 hour(s))  URINE  CULTURE     Status: None   Collection Time    12/01/13  8:16 PM      Result Value Range Status   Specimen Description URINE, CATHETERIZED   Final   Special Requests NONE   Final   Culture  Setup Time     Final   Value: 12/01/2013 02:10     Performed at [place]     Final   Value: 9,000 COLONIES/ML     Performed at [facility]   Culture     Final   Value: STAPHYLOCOCCUS SPECIES (COAGULASE NEGATIVE)     Note: RIFAMPIN AND GENTAMICIN SHOULD NOT BE USED AS SINGLE DRUGS FOR TREATMENT OF STAPH INFECTIONS.     Performed at [facility]   Report Status 12/04/2013 FINAL   Final   Organism ID, Bacteria STAPHYLOCOCCUS SPECIES (COAGULASE NEGATIVE)   Final     Studies: No results found.  Scheduled Meds: . brimonidine  1 drop Left Eye Daily  . calcium-vitamin D  1 tablet Oral Q breakfast  . cholecalciferol  1,000 Units Oral Daily  . dorzolamide  1 drop Both Eyes BID  . finasteride  5 mg Oral QHS  . multivitamin with minerals  1 tablet Oral Daily  . omega-3 acid ethyl esters  1 g Oral QHS  . silodosin  4 mg Oral Q breakfast  . vancomycin  1,250 mg Intravenous Q24H   Continuous Infusions:    Principal Problem:   Syncope Active Problems:   Hyponatremia   Pancreatic mass   Urinary retention   Dyslipidemia   Macular degeneration   Hypotension, unspecified   Hematuria   Bladder outlet obstruction   Urinary tract infection, site not specified    Time spent: > 35 minutes    Glenn Patterson  [facility] Pager [phone] If 7PM-7AM, please contact night-coverage at [email or web address], password [email or web address] 12/04/2013, 2:51 PM  LOS: 3 days

## 2013-12-04 NOTE — Progress Notes (Signed)
Patient ambulated 500 feet in hallway with walker.  Tolerated ambulation well. [place], Ardeth Sportsman

## 2013-12-04 NOTE — Progress Notes (Signed)
Patient in Afib, asymptomatic and HR 77-confirmed via EKG.  Dr. Wendee Beavers notified.  Will continue to monitor. Glenn Patterson, Ardeth Sportsman

## 2013-12-04 NOTE — Progress Notes (Signed)
At 2225 C/O "gas" hurting stomach, PRN maalox given along with 5mg  Oxy IR. Continued to complain of "severe" lower abd. Pain, decreased UO noted. [id number]. Irrigated foley with 20cc NS with immediate return of 900cc's of bloody output. Reported instant relief. No further complaint of rest of night. Patrici Ranks A

## 2013-12-04 NOTE — Progress Notes (Signed)
[facility]  Patient Status: Active (receiving services up to time of hospitalization)  [facility] is providing the following services: RN.  He returned to the hospital before the RN from [facility] could see him at home.   If patient discharges after hours, please call [phone].   Hansel Feinstein 12/04/2013, 3:45 PM

## 2013-12-04 NOTE — Progress Notes (Signed)
Subjective:  1 - Hematuria, Urinary Retention - Pt with new hematuria culminating with frank clots and retention 12/01/13 at home. Recently DC'd from hospital stay during which stomach and likely pancreatic cancer discovered and pt developed new urinary retention following GI procedure and failed trial of void x several. Only current blood thinner ASA. No prior episodes. CT 10/2013 w/o large GU mass, but does reveal massive prostate. UCX with MRSA, now on Vanc as per CX. Hgb down to 7.6 2/8 and transfused to 8.5 2/9. Remains on slow bladder irrigation.   2 - Massive Prostatic Hypertrophy - 250gm prostate by imaging. Minimal baseline voiding smptoms prior to recent admission. Started finasteride this admission to help reduce prostatic vascularitiy.  Today Glenn Patterson is seen in f/u above. No complaints. Started on Vanc as per UCX.   Objective: Vital signs in last 24 hours: Temp:  [97.8 F (36.6 C)-98.6 F (37 C)] 97.8 F (36.6 C) (02/09 1233) Pulse Rate:  [67-78] 70 (02/09 1233) Resp:  [18-20] 18 (02/09 1233) BP: (127-142)/(48-61) 142/61 mmHg (02/09 1233) SpO2:  [98 %-100 %] 99 % (02/09 1233) Last BM Date: 12/02/13  Intake/Output from previous day: 02/08 0701 - 02/09 0700 In: 1462.5 [P.O.:1200; I.V.:250; Blood:12.5] Out: 8200 [Urine:8200] Intake/Output this shift: Total I/O In: 600 [P.O.:600] Out: 2050 [Urine:2050]  General appearance: alert, cooperative, appears stated age and family at bedside Head: Normocephalic, without obvious abnormality, atraumatic Eyes: conjunctivae/corneas clear. PERRL, EOM's intact. Fundi benign. Ears: normal TM's and external ear canals both ears Nose: Nares normal. Septum midline. Mucosa normal. No drainage or sinus tenderness. Throat: lips, mucosa, and tongue normal; teeth and gums normal Neck: no adenopathy, no carotid bruit, no JVD, supple, symmetrical, trachea midline and thyroid not enlarged, symmetric, no tenderness/mass/nodules Back: symmetric, no  curvature. ROM normal. No CVA tenderness. Resp: clear to auscultation bilaterally Chest wall: no tenderness Cardio: irregularly irregular, normal rate GI: soft, non-tender; bowel sounds normal; no masses,  no organomegaly Male genitalia: normal, foley c/d/i wtih clearing urine on slow CBI, still rose wine colord.  Extremities: extremities normal, atraumatic, no cyanosis or edema Pulses: 2+ and symmetric Skin: Skin color, texture, turgor normal. No rashes or lesions Lymph nodes: Cervical, supraclavicular, and axillary nodes normal. Neurologic: Grossly normal  Lab Results:   Recent Labs  12/03/13 0505 12/04/13 1220  WBC 13.1* 8.3  HGB 7.6* 8.5*  HCT 21.9* 24.2*  PLT 354 331   BMET  Recent Labs  12/01/13 1828 12/02/13 0401  NA 130* 131*  K 3.8 4.1  CL 92* 96  CO2 22 21  GLUCOSE 125* 103*  BUN 14 16  CREATININE 0.66 0.61  CALCIUM 9.1 8.3*   PT/INR  Recent Labs  12/01/13 1828  LABPROT 12.8  INR 0.98   ABG No results found for this basename: PHART, PCO2, PO2, HCO3,  in the last 72 hours  Studies/Results: No results found.  Anti-infectives: Anti-infectives   Start     Dose/Rate Route Frequency Ordered Stop   12/04/13 1300  vancomycin (VANCOCIN) 1,250 mg in sodium chloride 0.9 % 250 mL IVPB     1,250 mg 166.7 mL/hr over 90 Minutes Intravenous Every 24 hours 12/04/13 1219     12/02/13 2000  cefTRIAXone (ROCEPHIN) 1 g in dextrose 5 % 50 mL IVPB  Status:  Discontinued     1 g 100 mL/hr over 30 Minutes Intravenous Every 24 hours 12/01/13 2215 12/04/13 1220   12/01/13 2130  cefTRIAXone (ROCEPHIN) 1 g in dextrose 5 % 50 mL IVPB  1 g 100 mL/hr over 30 Minutes Intravenous  Once 12/01/13 2130 12/01/13 2332      Assessment/Plan:  1 - Hematuria, Urinary Retention - likely from large prostate, now stable on bladder irrigation, but not resolved. Started Vanc today for MRSA in urine which may also help. Will follow Hgb, if trends down again, will reconsider operative  intervention.   2 - Massive Prostatic Hypertrophy - continue finasteride, this will take some time for effect.  WIll follow, greatly appreciate hospitalist help. Hbg/Hct, CMP RX'd for AM tomorrow.   [facility], Landis Cassaro 12/04/2013

## 2013-12-04 NOTE — Progress Notes (Signed)
ANTIBIOTIC CONSULT NOTE - INITIAL  Pharmacy Consult for vancomycin Indication: UTI  No Known Allergies  Patient Measurements: Height: 5\' 8"  (172.7 cm) Weight: 155 lb 14.4 oz (70.716 kg) IBW/kg (Calculated) : 68.4 Adjusted Body Weight:   Vital Signs: Temp: 98.6 F (37 C) (02/09 0825) Temp src: Oral (02/09 0825) BP: 132/48 mmHg (02/09 0825) Pulse Rate: 74 (02/09 0825) Intake/Output from previous day: 02/08 0701 - 02/09 0700 In: 1462.5 [P.O.:1200; I.V.:250; Blood:12.5] Out: 8200 [Urine:8200] Intake/Output from this shift: Total I/O In: 240 [P.O.:240] Out: -   Labs:  Recent Labs  12/01/13 1828 12/02/13 0401 12/02/13 1530 12/03/13 0505  WBC 9.5 10.7* 12.9* 13.1*  HGB 10.2* 8.5* 8.4* 7.6*  PLT 394 340 360 354  CREATININE 0.66 0.61  --   --    Estimated Creatinine Clearance: 61.8 ml/min (by C-G formula based on Cr of 0.61). No results found for this basename: VANCOTROUGH, Corlis Leak, VANCORANDOM, GENTTROUGH, GENTPEAK, GENTRANDOM, TOBRATROUGH, TOBRAPEAK, TOBRARND, AMIKACINPEAK, AMIKACINTROU, AMIKACIN,  in the last 72 hours   Microbiology: Recent Results (from the past 720 hour(s))  URINE CULTURE     Status: None   Collection Time    12/01/13  8:16 PM      Result Value Range Status   Specimen Description URINE, CATHETERIZED   Final   Special Requests NONE   Final   Culture  Setup Time     Final   Value: 12/01/2013 02:10     Performed at [place]     Final   Value: 9,000 COLONIES/ML     Performed at [facility]   Culture     Final   Value: STAPHYLOCOCCUS SPECIES (COAGULASE NEGATIVE)     Note: RIFAMPIN AND GENTAMICIN SHOULD NOT BE USED AS SINGLE DRUGS FOR TREATMENT OF STAPH INFECTIONS.     Performed at [facility]   Report Status 12/04/2013 FINAL   Final   Organism ID, Bacteria STAPHYLOCOCCUS SPECIES (COAGULASE NEGATIVE)   Final    Medical History: Past Medical History  Diagnosis Date  . Hypertension   .  Hypercholesteremia   . Pancreatic mass   . Glaucoma   . Macular degeneration     Medications:  Scheduled:  . brimonidine  1 drop Left Eye Daily  . calcium-vitamin D  1 tablet Oral Q breakfast  . cholecalciferol  1,000 Units Oral Daily  . dorzolamide  1 drop Both Eyes BID  . finasteride  5 mg Oral QHS  . multivitamin with minerals  1 tablet Oral Daily  . omega-3 acid ethyl esters  1 g Oral QHS  . silodosin  4 mg Oral Q breakfast  . vancomycin  1,250 mg Intravenous Q24H   Infusions:   Assessment: 80 yo who was recently admitted for obstructive jaundice and urinary retention. He has has urinary cath. Urology said UTI could be a factor here. He was placed on empiric rocephin. His urine culture came back with CNS now. D/w Dr Wendee Beavers, will change rocephin to vanc since it's resistant.   Goal of Therapy:  Vancomycin trough level 10-15 mcg/ml  Plan:   Vanc 1.25g IV q24 Level if needed

## 2013-12-05 ENCOUNTER — Telehealth: Payer: Self-pay | Admitting: Gastroenterology

## 2013-12-05 LAB — HEMOGLOBIN AND HEMATOCRIT, BLOOD
HCT: 24.1 % — ABNORMAL LOW (ref 39.0–52.0)
Hemoglobin: 8.5 g/dL — ABNORMAL LOW (ref 13.0–17.0)

## 2013-12-05 LAB — COMPREHENSIVE METABOLIC PANEL
ALT: 314 U/L — ABNORMAL HIGH (ref 0–53)
AST: 309 U/L — ABNORMAL HIGH (ref 0–37)
Albumin: 2.3 g/dL — ABNORMAL LOW (ref 3.5–5.2)
Alkaline Phosphatase: 665 U/L — ABNORMAL HIGH (ref 39–117)
BILIRUBIN TOTAL: 2.7 mg/dL — AB (ref 0.3–1.2)
BUN: 9 mg/dL (ref 6–23)
CHLORIDE: 103 meq/L (ref 96–112)
CO2: 26 meq/L (ref 19–32)
Calcium: 8.3 mg/dL — ABNORMAL LOW (ref 8.4–10.5)
Creatinine, Ser: 0.54 mg/dL (ref 0.50–1.35)
GFR calc Af Amer: >90 mL/min (ref >90)
Glucose, Bld: 100 mg/dL — ABNORMAL HIGH (ref 70–99)
Potassium: 4.1 mEq/L (ref 3.7–5.3)
Sodium: 137 mEq/L (ref 137–147)
Total Protein: 5.4 g/dL — ABNORMAL LOW (ref 6.0–8.3)

## 2013-12-05 MED ORDER — HYDROXYZINE HCL 25 MG PO TABS
25.0000 mg | ORAL_TABLET | Freq: Three times a day (TID) | ORAL | Status: DC | PRN
  Administered 2013-12-05 – 2013-12-06 (×2): 25 mg via ORAL
  Filled 2013-12-05 (×3): qty 1

## 2013-12-05 NOTE — Progress Notes (Signed)
Patient ambulated 500 feet in hallway with walker, tolerated ambulation well.  Will continue to monitor. [place], Ardeth Sportsman

## 2013-12-05 NOTE — Progress Notes (Signed)
[facility] PROGRESS NOTE  FREDDERICK SWANGER EVO:[id number] DOB: Feb 23, 1923 DOA: 12/01/2013 PCP: No primary provider on file. Brief narrative 80 year old admitted for syncope with recent diagnosis of pancreatic mass undergoing GI workup for further evaluation. Presented to the hospital with hematuria. Patient with UTI on vancomycin and urology following. Currently undergoing bladder irrigation with plans for possible further urologic evaluation if no cessation in bleeding.   Assessment/Plan: 1. Syncope- Multifactorial, due to Orthostatic Hypotension, and Vaso-Vagal Response most likely. UTI may also have contributed.   - Resolved.  2. Hypotension - Resolved, Holding Benazepril, and HCTZ.  - Blood pressures given age well controlled off of antihypertensive medication.  3. Hematuria- due to Hemorrhagic Cystitis/+UTI, and/or Foley trauma from instrumentation, Seen By Urology Dr Tresa Moore in ED and new catheter placed and clot removed and bladder irrigation ordered by Urology.  - Continue to monitor cbc - Should hemoglobin dip below 7.9 with active bleeding will plan on transfusing again. - Urology considering cystoscopy should this problem continue.  4. Bladder Outlet Obstruction - caused by clot in bladder obstructing foley catheter, Urology removed clot and placed new foley. Currently undergoing bladder irrigation.  5. UTI - Urine C+S reviewed and patient will be placed on Vancomycin at this point.  Rocephin discontinued. No good oral options at this point.  6. Hyponatremia - resolved, discontinued HCTZ. - Most likely due to poor oral solute intake.   7. Pancreatic Mass- S/P Biliary Stent placed with Elevated LFTs: GI considering workup while in house and patient condition stable. Biopsy performed by [facility].   8. Urinary Retention- due to BPH- Foley placed by Urology, and started on Finasteride. - Management per urology  9. Dyslipidemia- Continue Omega 3 Fatty Acids daily.    10. Macular Degeneration/ and Glaucoma- Continue Alphagan and Trusopt Ophthalmic Drops as directed.   11. DVT Prophylaxis with SCDs.   Code Status: full Family Communication: Discussed with wife at bedside. Disposition Plan: Pending cessation of hematuria. Urology on board.   Consultants:  Urology  Procedures:  Bladder irrigation  Antibiotics:  rocephin  HPI/Subjective: No new complaints. No acute issues overnight.  Objective: Filed Vitals:   12/05/13 1236  BP: 130/64  Pulse: 77  Temp: 97.5 F (36.4 C)  Resp: 18    Intake/Output Summary (Last 24 hours) at 12/05/13 1651 Last data filed at 12/05/13 1200  Gross per 24 hour  Intake      0 ml  Output   5500 ml  Net  -5500 ml   Filed Weights   12/01/13 2359  Weight: 70.716 kg (155 lb 14.4 oz)    Exam:   General:  Pt in NAD, Alert and awake  Cardiovascular: RRR, no MRG  Respiratory: CTA BL, no wheezes  Abdomen: soft, NT, ND  Musculoskeletal: no cyanosis or clubbing  Data Reviewed: Basic Metabolic Panel:  Recent Labs Lab 12/01/13 1828 12/02/13 0401 12/05/13 0420  NA 130* 131* 137  K 3.8 4.1 4.1  CL 92* 96 103  CO2 22 21 26   GLUCOSE 125* 103* 100*  BUN 14 16 9   CREATININE 0.66 0.61 0.54  CALCIUM 9.1 8.3* 8.3*   Liver Function Tests:  Recent Labs Lab 12/01/13 1828 12/05/13 0420  AST 131* 309*  ALT 206* 314*  ALKPHOS 649* 665*  BILITOT 4.2* 2.7*  PROT 6.7 5.4*  ALBUMIN 3.1* 2.3*   No results found for this basename: LIPASE, AMYLASE,  in the last 168 hours No results found for this basename: AMMONIA,  in the last  168 hours CBC:  Recent Labs Lab 12/01/13 1828 12/02/13 0401 12/02/13 1530 12/03/13 0505 12/04/13 1220 12/05/13 0420  WBC 9.5 10.7* 12.9* 13.1* 8.3  --   NEUTROABS 6.8  --   --   --   --   --   HGB 10.2* 8.5* 8.4* 7.6* 8.5* 8.5*  HCT 28.4* 23.9* 23.6* 21.9* 24.2* 24.1*  MCV 90.4 90.9 91.5 92.8 90.6  --   PLT 394 340 360 354 331  --    Cardiac  Enzymes:  Recent Labs Lab 12/01/13 1828  TROPONINI <0.30   BNP (last 3 results) No results found for this basename: PROBNP,  in the last 8760 hours CBG: No results found for this basename: GLUCAP,  in the last 168 hours  Recent Results (from the past 240 hour(s))  URINE CULTURE     Status: None   Collection Time    12/01/13  8:16 PM      Result Value Range Status   Specimen Description URINE, CATHETERIZED   Final   Special Requests NONE   Final   Culture  Setup Time     Final   Value: 12/01/2013 02:10     Performed at [place]     Final   Value: 9,000 COLONIES/ML     Performed at [facility]   Culture     Final   Value: STAPHYLOCOCCUS SPECIES (COAGULASE NEGATIVE)     Note: RIFAMPIN AND GENTAMICIN SHOULD NOT BE USED AS SINGLE DRUGS FOR TREATMENT OF STAPH INFECTIONS.     Performed at [facility]   Report Status 12/04/2013 FINAL   Final   Organism ID, Bacteria STAPHYLOCOCCUS SPECIES (COAGULASE NEGATIVE)   Final     Studies: No results found.  Scheduled Meds: . brimonidine  1 drop Left Eye Daily  . calcium-vitamin D  1 tablet Oral Q breakfast  . cholecalciferol  1,000 Units Oral Daily  . dorzolamide  1 drop Both Eyes BID  . finasteride  5 mg Oral QHS  . multivitamin with minerals  1 tablet Oral Daily  . omega-3 acid ethyl esters  1 g Oral QHS  . silodosin  4 mg Oral Q breakfast  . vancomycin  1,250 mg Intravenous Q24H   Continuous Infusions:    Principal Problem:   Syncope Active Problems:   Hyponatremia   Pancreatic mass   Urinary retention   Dyslipidemia   Macular degeneration   Hypotension, unspecified   Hematuria   Bladder outlet obstruction   Urinary tract infection, site not specified    Time spent: > 35 minutes    Velvet Bathe  [facility] Pager [phone] If 7PM-7AM, please contact night-coverage at [email or web address], password [facility] 12/05/2013, 4:51 PM  LOS: 4 days

## 2013-12-05 NOTE — Telephone Encounter (Signed)
I spoke with Glenn Patterson and she states the pt is going to have procedure as scheduled

## 2013-12-05 NOTE — Telephone Encounter (Signed)
FYI Dr Ardis Hughs I will call endo and cx

## 2013-12-05 NOTE — OR Nursing (Signed)
Called [facility] to schedule patient pick up for Thursday 12/07/13 scheduled to arrive at 11:00am.  This is to correct date from 12/06/13 to 12/07/13.

## 2013-12-05 NOTE — Telephone Encounter (Signed)
Judson Roch and Glendell Docker, This man is scheduled for EUS (to FNA pancreatic head mass) and same time ERCP (to change plastic stent put in by Dr. Benson Norway 2 weeks ago for metal stent). Was to be this Thursday as out patient. He is now admitted with urologic issues.  I think it is in his best interest to still have the EUS, ERCP this week if it is OK medically.  Judson Roch, Can you see him in hosp, recommend that [place] still like to go ahead with procedures on Thursday if OK with his admitting team?  He can be sent via [facility] to [place] on Thursday. We'll just have to let endo know.

## 2013-12-05 NOTE — Progress Notes (Signed)
Patient is scheduled for EUS/ERCP on Thursday 2/12.  I spoke with Dr. Tresa Moore who thinks that patient is stable enough and should proceed with EUS/ERCP.  Spoke with patient and his wife who are agreeable.  Spoke with [facility] endoscopy.  [facility] has already been arranged to transport patient to and from [facility].

## 2013-12-05 NOTE — OR Nursing (Signed)
Spoke with Janett Billow, patient's nurse, regarding transport to [facility] for EUS/ERCP on Thursday 12/07/13.  She will call [facility] to make arrangements.

## 2013-12-05 NOTE — Care Management Note (Unsigned)
    Page 1 of 1   12/05/2013     4:39:42 PM   CARE MANAGEMENT NOTE 12/05/2013  Patient:  Glenn Patterson, Glenn Patterson   Account Number:  [id number]  Date Initiated:  12/05/2013  Documentation initiated by:  GRAVES-BIGELOW,Angelo Prindle  Subjective/Objective Assessment:   Pt admitted for syncope. Recently diagnosed pancreatic mass. UTI-pt on iv vancomycin. Hematuria- due to Hemorrhagic Cystitis/+UTI- plan bladder irrigation. Plan [place] for EUS/ERCP on Thursday 12/07/13.     Action/Plan:   Pt is active with [facility] for [facility] RN Services. Will need resumption orders if plan is for home.   Anticipated DC Date:  12/08/2013   Anticipated DC Plan:  [place]  CM consult      Choice offered to / List presented to:             Status of service:  In process, will continue to follow Medicare Important Message given?   (If response is "NO", the following Medicare IM given date fields will be blank) Date Medicare IM given:   Date Additional Medicare IM given:    Discharge Disposition:    Per UR Regulation:  Reviewed for med. necessity/level of care/duration of stay  If discussed at [place] of Stay Meetings, dates discussed:    Comments:

## 2013-12-05 NOTE — Progress Notes (Signed)
I agree with plan to continue with ERCP, EUS on Thursday.

## 2013-12-05 NOTE — Progress Notes (Addendum)
Called [facility] and spoke with Marcello Moores, arranged for patient to be transported to [place] endoscopy Thursday (2/12) no later than 11 am.  [place], Ardeth Sportsman

## 2013-12-06 ENCOUNTER — Telehealth: Payer: Self-pay | Admitting: *Deleted

## 2013-12-06 LAB — HEMOGLOBIN AND HEMATOCRIT, BLOOD
HEMATOCRIT: 25.4 % — AB (ref 39.0–52.0)
HEMOGLOBIN: 9.1 g/dL — AB (ref 13.0–17.0)

## 2013-12-06 MED ORDER — SODIUM CHLORIDE 0.9 % IV SOLN: INTRAVENOUS | Status: DC

## 2013-12-06 NOTE — Progress Notes (Signed)
Subjective:  1 - Hematuria, Urinary Retention - Pt with new hematuria culminating with frank clots and retention 12/01/13 at home. Recently DC'd from hospital stay during which stomach and likely pancreatic cancer discovered and pt developed new urinary retention following GI procedure and failed trial of void x several. Only current blood thinner ASA. No prior episodes. CT 10/2013 w/o large GU mass, but does reveal massive prostate. UCX with MRSA, now on Vanc as per CX. Hgb down to 7.6 2/8 and transfused to 8.5 2/9. Bladder irrigation stopped 2/11 as urine continuing to clear and Hgb now trend up.  2 - Massive Prostatic Hypertrophy - 250gm prostate by imaging. Minimal baseline voiding smptoms prior to recent admission. Started finasteride this admission to help reduce prostatic vascularitiy.  Today Glenn Patterson is seen in f/u above. No complaints. No catheter problems. He is going to undergo EUS / pancreatic biopsy tomorrow at [place] lab.  Objective: Vital signs in last 24 hours: Temp:  [97.3 F (36.3 C)-98.3 F (36.8 C)] 98 F (36.7 C) (02/11 1229) Pulse Rate:  [56-69] 69 (02/11 1229) Resp:  [18-20] 18 (02/11 1229) BP: (132-156)/(47-85) 137/67 mmHg (02/11 1229) SpO2:  [95 %-99 %] 97 % (02/11 1229) Weight:  [71.623 kg (157 lb 14.4 oz)] 71.623 kg (157 lb 14.4 oz) (02/11 0506) Last BM Date: 12/05/13  Intake/Output from previous day: 02/10 0701 - 02/11 0700 In: -  Out: 5200 [Urine:5200] Intake/Output this shift: Total I/O In: 150 [P.O.:150] Out: -   General appearance: alert, cooperative, appears stated age and family at bedside, pt in very good spirits Head: Normocephalic, without obvious abnormality, atraumatic Eyes: conjunctivae/corneas clear. PERRL, EOM's intact. Fundi benign. Ears: normal TM's and external ear canals both ears Nose: Nares normal. Septum midline. Mucosa normal. No drainage or sinus tenderness. Throat: lips, mucosa, and tongue normal; teeth and gums normal Neck: no  adenopathy, no carotid bruit, no JVD, supple, symmetrical, trachea midline and thyroid not enlarged, symmetric, no tenderness/mass/nodules Back: symmetric, no curvature. ROM normal. No CVA tenderness. Resp: clear to auscultation bilaterally Chest wall: no tenderness Cardio: regular rate and rhythm, S1, S2 normal, no murmur, click, rub or gallop GI: soft, non-tender; bowel sounds normal; no masses,  no organomegaly Male genitalia: normal, foley c/d/i with dark yellow urine, no clots off bladder irrigation. Extremities: extremities normal, atraumatic, no cyanosis or edema Pulses: 2+ and symmetric Skin: Skin color, texture, turgor normal. No rashes or lesions Lymph nodes: Cervical, supraclavicular, and axillary nodes normal. Neurologic: Grossly normal  Lab Results:   Recent Labs  12/04/13 1220 12/05/13 0420 12/06/13 0935  WBC 8.3  --   --   HGB 8.5* 8.5* 9.1*  HCT 24.2* 24.1* 25.4*  PLT 331  --   --    BMET  Recent Labs  12/05/13 0420  NA 137  K 4.1  CL 103  CO2 26  GLUCOSE 100*  BUN 9  CREATININE 0.54  CALCIUM 8.3*   PT/INR No results found for this basename: LABPROT, INR,  in the last 72 hours ABG No results found for this basename: PHART, PCO2, PO2, HCO3,  in the last 72 hours  Studies/Results: No results found.  Anti-infectives: Anti-infectives   Start     Dose/Rate Route Frequency Ordered Stop   12/04/13 1300  vancomycin (VANCOCIN) 1,250 mg in sodium chloride 0.9 % 250 mL IVPB  Status:  Discontinued     1,250 mg 166.7 mL/hr over 90 Minutes Intravenous Every 24 hours 12/04/13 1219 12/06/13 1150   12/02/13 2000  cefTRIAXone (ROCEPHIN) 1 g in dextrose 5 % 50 mL IVPB  Status:  Discontinued     1 g 100 mL/hr over 30 Minutes Intravenous Every 24 hours 12/01/13 2215 12/04/13 1220   12/01/13 2130  cefTRIAXone (ROCEPHIN) 1 g in dextrose 5 % 50 mL IVPB     1 g 100 mL/hr over 30 Minutes Intravenous  Once 12/01/13 2130 12/01/13 2332      Assessment/Plan:  1 -  Hematuria, Urinary Retention - likely from large prostate, now improving significantly and off irrigation. Keep current foley for now, likely DC with it with trial of void as outpatient at Urology office.  2 - Massive Prostatic Hypertrophy - continue finasteride, this will take some time for effect. He will need to continue this at discharge.  Will follow.  [facility], Tiona Ruane 12/06/2013

## 2013-12-06 NOTE — Progress Notes (Signed)
[facility] PROGRESS NOTE  Glenn Patterson IRW:[id number] DOB: Apr 13, 1923 DOA: 12/01/2013 PCP: No primary provider on file. Brief narrative 80 year old admitted for syncope with recent diagnosis of pancreatic mass undergoing GI workup for further evaluation. Presented to the hospital with hematuria. Patient with UTI on vancomycin and urology following. Currently undergoing bladder irrigation with plans for possible further urologic evaluation if no cessation in bleeding.   Assessment/Plan: 1. Syncope- Multifactorial, due to Orthostatic Hypotension, and Vaso-Vagal Response most likely.   2. Hypotension - Resolved, Holding Benazepril, and HCTZ.  - Blood pressures given age well controlled off of antihypertensive medication.  3. Hematuria/urinary retention - due to BPH/+UTI, and/or Foley trauma ,  - Urology Dr Tresa Moore following currently on CBI, still with hematuria, may need cystoscopy - Continue to monitor Hb -now on Rapaflo  4. Bladder Outlet Obstruction - as above  5. UTI unlikely - 9K colonies of Coag negative staph, likely colonization from recent instrumentation -now on IV Vanc, stop this   6. Hyponatremia - resolved, discontinued HCTZ. - Most likely due to poor oral  intake.   7. Pancreatic Mass- S/P Biliary Stent placed with Elevated LFTs -for ERCP/EUS tomorrow with stent exchange per Rudolpho Sevin at [facility]  8. Urinary Retention- due to BPH- Foley placed by Urology, and started on Finasteride. - Management per urology  9. Dyslipidemia- Continue Omega 3 Fatty Acids daily.   10. Macular Degeneration/ and Glaucoma- Continue Alphagan and Trusopt Ophthalmic Drops as directed.   DVT Prophylaxis with SCDs.   Code Status: full Family Communication: Discussed with wife at bedside. Disposition Plan: Pending cessation of hematuria.   Consultants:  Urology  Procedures:  Bladder irrigation  Antibiotics:  Vanc  HPI/Subjective: No new complaints.  No acute issues overnight.  Objective: Filed Vitals:   12/06/13 0815  BP: 147/85  Pulse: 61  Temp: 97.9 F (36.6 C)  Resp: 20    Intake/Output Summary (Last 24 hours) at 12/06/13 1143 Last data filed at 12/06/13 0717  Gross per 24 hour  Intake    150 ml  Output   4800 ml  Net  -4650 ml   Filed Weights   12/01/13 2359 12/06/13 0506  Weight: 70.716 kg (155 lb 14.4 oz) 71.623 kg (157 lb 14.4 oz)    Exam:   General:  Pt in NAD, Alert and awake  Cardiovascular: RRR, no MRG  Respiratory: CTA BL, no wheezes  Abdomen: soft, NT, ND  Musculoskeletal: no cyanosis or clubbing  Data Reviewed: Basic Metabolic Panel:  Recent Labs Lab 12/01/13 1828 12/02/13 0401 12/05/13 0420  NA 130* 131* 137  K 3.8 4.1 4.1  CL 92* 96 103  CO2 22 21 26   GLUCOSE 125* 103* 100*  BUN 14 16 9   CREATININE 0.66 0.61 0.54  CALCIUM 9.1 8.3* 8.3*   Liver Function Tests:  Recent Labs Lab 12/01/13 1828 12/05/13 0420  AST 131* 309*  ALT 206* 314*  ALKPHOS 649* 665*  BILITOT 4.2* 2.7*  PROT 6.7 5.4*  ALBUMIN 3.1* 2.3*   No results found for this basename: LIPASE, AMYLASE,  in the last 168 hours No results found for this basename: AMMONIA,  in the last 168 hours CBC:  Recent Labs Lab 12/01/13 1828 12/02/13 0401 12/02/13 1530 12/03/13 0505 12/04/13 1220 12/05/13 0420 12/06/13 0935  WBC 9.5 10.7* 12.9* 13.1* 8.3  --   --   NEUTROABS 6.8  --   --   --   --   --   --  HGB 10.2* 8.5* 8.4* 7.6* 8.5* 8.5* 9.1*  HCT 28.4* 23.9* 23.6* 21.9* 24.2* 24.1* 25.4*  MCV 90.4 90.9 91.5 92.8 90.6  --   --   PLT 394 340 360 354 331  --   --    Cardiac Enzymes:  Recent Labs Lab 12/01/13 1828  TROPONINI <0.30   BNP (last 3 results) No results found for this basename: PROBNP,  in the last 8760 hours CBG: No results found for this basename: GLUCAP,  in the last 168 hours  Recent Results (from the past 240 hour(s))  URINE CULTURE     Status: None   Collection Time    12/01/13  8:16  PM      Result Value Ref Range Status   Specimen Description URINE, CATHETERIZED   Final   Special Requests NONE   Final   Culture  Setup Time     Final   Value: 12/01/2013 02:10     Performed at [place]     Final   Value: 9,000 COLONIES/ML     Performed at [facility]   Culture     Final   Value: STAPHYLOCOCCUS SPECIES (COAGULASE NEGATIVE)     Note: RIFAMPIN AND GENTAMICIN SHOULD NOT BE USED AS SINGLE DRUGS FOR TREATMENT OF STAPH INFECTIONS.     Performed at [facility]   Report Status 12/04/2013 FINAL   Final   Organism ID, Bacteria STAPHYLOCOCCUS SPECIES (COAGULASE NEGATIVE)   Final     Studies: No results found.  Scheduled Meds: . brimonidine  1 drop Left Eye Daily  . calcium-vitamin D  1 tablet Oral Q breakfast  . cholecalciferol  1,000 Units Oral Daily  . dorzolamide  1 drop Both Eyes BID  . finasteride  5 mg Oral QHS  . multivitamin with minerals  1 tablet Oral Daily  . omega-3 acid ethyl esters  1 g Oral QHS  . silodosin  4 mg Oral Q breakfast  . vancomycin  1,250 mg Intravenous Q24H   Continuous Infusions:    Principal Problem:   Syncope Active Problems:   Hyponatremia   Pancreatic mass   Urinary retention   Dyslipidemia   Macular degeneration   Hypotension, unspecified   Hematuria   Bladder outlet obstruction   Urinary tract infection, site not specified    Time spent: > 25 minutes    Treena Cosman  [facility] Pager [phone] If 7PM-7AM, please contact night-coverage at [email or web address], password [facility] 12/06/2013, 11:43 AM  LOS: 5 days

## 2013-12-06 NOTE — Telephone Encounter (Signed)
Spoke with patient's wife by phone and confirmed appointment for her husband on 12/12/13 with Dr. Benay Spice.  Contact names, numbers, and directions were provided.

## 2013-12-07 ENCOUNTER — Ambulatory Visit (HOSPITAL_COMMUNITY): Admission: RE | Admit: 2013-12-07 | Payer: Medicare HMO | Source: Ambulatory Visit | Admitting: Gastroenterology

## 2013-12-07 ENCOUNTER — Inpatient Hospital Stay (HOSPITAL_COMMUNITY): Payer: Medicare HMO | Admitting: Anesthesiology

## 2013-12-07 ENCOUNTER — Encounter (HOSPITAL_COMMUNITY): Payer: Self-pay

## 2013-12-07 ENCOUNTER — Inpatient Hospital Stay (HOSPITAL_COMMUNITY): Payer: Medicare HMO

## 2013-12-07 ENCOUNTER — Encounter (HOSPITAL_COMMUNITY): Payer: Medicare HMO | Admitting: Anesthesiology

## 2013-12-07 ENCOUNTER — Encounter (HOSPITAL_COMMUNITY)
Admission: EM | Disposition: A | Discharge status: Home / Self Care | Payer: Self-pay | Source: Home / Self Care | Attending: Family Medicine

## 2013-12-07 DIAGNOSIS — R17 Unspecified jaundice: Secondary | ICD-10-CM

## 2013-12-07 HISTORY — PX: EUS: SHX5427

## 2013-12-07 HISTORY — PX: ENDOSCOPIC RETROGRADE CHOLANGIOPANCREATOGRAPHY (ERCP) WITH PROPOFOL: SHX5810

## 2013-12-07 LAB — BASIC METABOLIC PANEL
BUN: 11 mg/dL (ref 6–23)
CALCIUM: 8.9 mg/dL (ref 8.4–10.5)
CO2: 26 mEq/L (ref 19–32)
Chloride: 100 mEq/L (ref 96–112)
Creatinine, Ser: 0.57 mg/dL (ref 0.50–1.35)
GFR, EST NON AFRICAN AMERICAN: 89 mL/min — AB (ref >90)
Glucose, Bld: 107 mg/dL — ABNORMAL HIGH (ref 70–99)
POTASSIUM: 4.1 meq/L (ref 3.7–5.3)
SODIUM: 136 meq/L — AB (ref 137–147)

## 2013-12-07 LAB — CBC
HCT: 28.1 % — ABNORMAL LOW (ref 39.0–52.0)
Hemoglobin: 9.6 g/dL — ABNORMAL LOW (ref 13.0–17.0)
MCH: 31.5 pg (ref 26.0–34.0)
MCHC: 34.2 g/dL (ref 30.0–36.0)
MCV: 92.1 fL (ref 78.0–100.0)
Platelets: 387 10*3/uL (ref 150–400)
RBC: 3.05 MIL/uL — ABNORMAL LOW (ref 4.22–5.81)
RDW: 17.6 % — AB (ref 11.5–15.5)
WBC: 8.8 10*3/uL (ref 4.0–10.5)

## 2013-12-07 SURGERY — UPPER ENDOSCOPIC ULTRASOUND (EUS) RADIAL
Anesthesia: General

## 2013-12-07 MED ORDER — GLYCOPYRROLATE 0.2 MG/ML IJ SOLN
INTRAMUSCULAR | Status: AC
  Filled 2013-12-07: qty 3

## 2013-12-07 MED ORDER — PROPOFOL 10 MG/ML IV BOLUS
INTRAVENOUS | Status: AC
  Filled 2013-12-07: qty 20

## 2013-12-07 MED ORDER — PHENYLEPHRINE HCL 10 MG/ML IJ SOLN
INTRAMUSCULAR | Status: DC | PRN
  Administered 2013-12-07 (×3): 40 ug via INTRAVENOUS
  Administered 2013-12-07: 120 ug via INTRAVENOUS
  Administered 2013-12-07: 20 ug via INTRAVENOUS

## 2013-12-07 MED ORDER — LIDOCAINE HCL (CARDIAC) 20 MG/ML IV SOLN
INTRAVENOUS | Status: AC
  Filled 2013-12-07: qty 5

## 2013-12-07 MED ORDER — BUTAMBEN-TETRACAINE-BENZOCAINE 2-2-14 % EX AERO
INHALATION_SPRAY | CUTANEOUS | Status: DC | PRN
  Administered 2013-12-07: 1 via TOPICAL

## 2013-12-07 MED ORDER — LACTATED RINGERS IV SOLN
INTRAVENOUS | Status: DC | PRN
  Administered 2013-12-07 (×2): via INTRAVENOUS

## 2013-12-07 MED ORDER — LACTATED RINGERS IV SOLN
INTRAVENOUS | Status: DC | PRN
  Administered 2013-12-07: 13:00:00 via INTRAVENOUS

## 2013-12-07 MED ORDER — PROPOFOL 10 MG/ML IV BOLUS
INTRAVENOUS | Status: DC | PRN
  Administered 2013-12-07: 100 mg via INTRAVENOUS

## 2013-12-07 MED ORDER — FENTANYL CITRATE 0.05 MG/ML IJ SOLN
INTRAMUSCULAR | Status: DC | PRN
  Administered 2013-12-07: 25 ug via INTRAVENOUS
  Administered 2013-12-07 (×2): 50 ug via INTRAVENOUS

## 2013-12-07 MED ORDER — SODIUM CHLORIDE 0.9 % IJ SOLN
INTRAMUSCULAR | Status: AC
  Filled 2013-12-07: qty 10

## 2013-12-07 MED ORDER — SUCCINYLCHOLINE CHLORIDE 20 MG/ML IJ SOLN
INTRAMUSCULAR | Status: DC | PRN
  Administered 2013-12-07: 100 mg via INTRAVENOUS

## 2013-12-07 MED ORDER — EPHEDRINE SULFATE 50 MG/ML IJ SOLN
INTRAMUSCULAR | Status: AC
  Filled 2013-12-07: qty 1

## 2013-12-07 MED ORDER — FENTANYL CITRATE 0.05 MG/ML IJ SOLN
INTRAMUSCULAR | Status: AC
  Filled 2013-12-07: qty 2

## 2013-12-07 MED ORDER — GLYCOPYRROLATE 0.2 MG/ML IJ SOLN
INTRAMUSCULAR | Status: DC | PRN
  Administered 2013-12-07: 0.6 mg via INTRAVENOUS

## 2013-12-07 MED ORDER — CISATRACURIUM BESYLATE (PF) 10 MG/5ML IV SOLN
INTRAVENOUS | Status: DC | PRN
  Administered 2013-12-07: 2 mg via INTRAVENOUS
  Administered 2013-12-07: 6 mg via INTRAVENOUS
  Administered 2013-12-07 (×2): 2 mg via INTRAVENOUS

## 2013-12-07 MED ORDER — NEOSTIGMINE METHYLSULFATE 1 MG/ML IJ SOLN
INTRAMUSCULAR | Status: DC | PRN
  Administered 2013-12-07: 5 mg via INTRAVENOUS

## 2013-12-07 MED ORDER — LACTATED RINGERS IV SOLN: INTRAVENOUS | Status: DC

## 2013-12-07 MED ORDER — CISATRACURIUM BESYLATE 20 MG/10ML IV SOLN
INTRAVENOUS | Status: AC
  Filled 2013-12-07: qty 10

## 2013-12-07 MED ORDER — ONDANSETRON HCL 4 MG/2ML IJ SOLN
INTRAMUSCULAR | Status: DC | PRN
  Administered 2013-12-07: 4 mg via INTRAVENOUS

## 2013-12-07 MED ORDER — DEXAMETHASONE SODIUM PHOSPHATE 10 MG/ML IJ SOLN
INTRAMUSCULAR | Status: DC | PRN
  Administered 2013-12-07: 10 mg via INTRAVENOUS

## 2013-12-07 MED ORDER — LACTATED RINGERS IV SOLN
INTRAVENOUS | Status: DC
  Administered 2013-12-07: 1000 mL via INTRAVENOUS

## 2013-12-07 MED ORDER — NEOSTIGMINE METHYLSULFATE 1 MG/ML IJ SOLN
INTRAMUSCULAR | Status: AC
  Filled 2013-12-07: qty 10

## 2013-12-07 MED ORDER — CIPROFLOXACIN IN D5W 400 MG/200ML IV SOLN
INTRAVENOUS | Status: AC
  Filled 2013-12-07: qty 200

## 2013-12-07 MED ORDER — BENAZEPRIL HCL 10 MG PO TABS
10.0000 mg | ORAL_TABLET | Freq: Every day | ORAL | Status: DC
  Administered 2013-12-08: 10 mg via ORAL
  Filled 2013-12-07: qty 1

## 2013-12-07 MED ORDER — PROMETHAZINE HCL 25 MG/ML IJ SOLN: 6.2500 mg | INTRAMUSCULAR | Status: DC | PRN

## 2013-12-07 NOTE — Progress Notes (Signed)
[facility] PROGRESS NOTE  CLEARANCE Glenn Patterson QMV:[id number] DOB: Dec 20, 1922 DOA: 12/01/2013 PCP: No primary provider on file. Brief narrative 80 year old admitted for syncope with recent diagnosis of pancreatic mass undergoing GI workup for further evaluation. Presented to the hospital with hematuria. Patient with UTI on vancomycin and urology following.  undergoinwent bladder irrigation   Assessment/Plan: 1. Syncope- Multifactorial, due to Orthostatic Hypotension, and Vaso-Vagal Response most likely.   2. Hypotension - Resolved, resume Benazepril,  - HCTZ on hold  3. Hematuria/urinary retention - due to BPH/+UTI, and/or Foley trauma ,  - Urology Dr Tresa Moore following, stopped CBI yesterday, urine clearing, may need to DC home with foley -monitor Hb -now on Rapaflo  4. Bladder Outlet Obstruction - as above  5. UTI unlikely - 9K colonies of Coag negative staph, likely colonization from recent instrumentation -now on IV Vanc, stop this   6. Hyponatremia - resolved, discontinued HCTZ. - Most likely due to poor oral  intake.   7. Pancreatic Mass- S/P Biliary Stent placed with Elevated LFTs -s/p ERCP/EUS with stent exchange per Dr.Gessnar today -prelim biopsy s/o Islet cell tumor  8. Urinary Retention- due to BPH- Foley placed by Urology, and started on Finasteride. - Management per urology  9. Dyslipidemia- Continue Omega 3 Fatty Acids daily.   10. Macular Degeneration/ and Glaucoma- Continue Alphagan and Trusopt Ophthalmic Drops as directed.   DVT Prophylaxis with SCDs.   DC tele ambulate  Code Status: full Family Communication: Discussed with wife at bedside. Disposition Plan: home tomorrow   Consultants:  Urology  Procedures:  Bladder irrigation  Antibiotics:  Vanc  HPI/Subjective: No new complaints. No acute issues overnight.  Objective: Filed Vitals:   12/07/13 1719  BP: 151/52  Pulse: 69  Temp: 97.4 F (36.3 C)  Resp: 16    Intake/Output  Summary (Last 24 hours) at 12/07/13 2040 Last data filed at 12/07/13 1505  Gross per 24 hour  Intake   2800 ml  Output   2050 ml  Net    750 ml   Filed Weights   12/01/13 2359 12/06/13 0506  Weight: 70.716 kg (155 lb 14.4 oz) 71.623 kg (157 lb 14.4 oz)    Exam:   General:  Pt in NAD, Alert and awake  Cardiovascular: RRR, no MRG  Respiratory: CTA BL, no wheezes  Abdomen: soft, NT, ND  Musculoskeletal: no cyanosis or clubbing  Data Reviewed: Basic Metabolic Panel:  Recent Labs Lab 12/01/13 1828 12/02/13 0401 12/05/13 0420 12/07/13 0417  NA 130* 131* 137 136*  K 3.8 4.1 4.1 4.1  CL 92* 96 103 100  CO2 22 21 26 26   GLUCOSE 125* 103* 100* 107*  BUN 14 16 9 11   CREATININE 0.66 0.61 0.54 0.57  CALCIUM 9.1 8.3* 8.3* 8.9   Liver Function Tests:  Recent Labs Lab 12/01/13 1828 12/05/13 0420  AST 131* 309*  ALT 206* 314*  ALKPHOS 649* 665*  BILITOT 4.2* 2.7*  PROT 6.7 5.4*  ALBUMIN 3.1* 2.3*   No results found for this basename: LIPASE, AMYLASE,  in the last 168 hours No results found for this basename: AMMONIA,  in the last 168 hours CBC:  Recent Labs Lab 12/01/13 1828 12/02/13 0401 12/02/13 1530 12/03/13 0505 12/04/13 1220 12/05/13 0420 12/06/13 0935 12/07/13 0417  WBC 9.5 10.7* 12.9* 13.1* 8.3  --   --  8.8  NEUTROABS 6.8  --   --   --   --   --   --   --  HGB 10.2* 8.5* 8.4* 7.6* 8.5* 8.5* 9.1* 9.6*  HCT 28.4* 23.9* 23.6* 21.9* 24.2* 24.1* 25.4* 28.1*  MCV 90.4 90.9 91.5 92.8 90.6  --   --  92.1  PLT 394 340 360 354 331  --   --  387   Cardiac Enzymes:  Recent Labs Lab 12/01/13 1828  TROPONINI <0.30   BNP (last 3 results) No results found for this basename: PROBNP,  in the last 8760 hours CBG: No results found for this basename: GLUCAP,  in the last 168 hours  Recent Results (from the past 240 hour(s))  URINE CULTURE     Status: None   Collection Time    12/01/13  8:16 PM      Result Value Ref Range Status   Specimen Description  URINE, CATHETERIZED   Final   Special Requests NONE   Final   Culture  Setup Time     Final   Value: 12/01/2013 02:10     Performed at [facility]     Final   Value: 9,000 COLONIES/ML     Performed at [facility]   Culture     Final   Value: STAPHYLOCOCCUS SPECIES (COAGULASE NEGATIVE)     Note: RIFAMPIN AND GENTAMICIN SHOULD NOT BE USED AS SINGLE DRUGS FOR TREATMENT OF STAPH INFECTIONS.     Performed at [facility]   Report Status 12/04/2013 FINAL   Final   Organism ID, Bacteria STAPHYLOCOCCUS SPECIES (COAGULASE NEGATIVE)   Final     Studies: Dg Ercp Biliary & Pancreatic Ducts  12/07/2013   CLINICAL DATA:  Stent placement, pancreatic mass  EXAM: ERCP  TECHNIQUE: Multiple spot images obtained with the fluoroscopic device and submitted for interpretation post-procedure.  COMPARISON:  DG ERCP WO/W SPHINCTEROTOMY dated 11/26/2013; CT ABD/PELVIS W CM dated 11/24/2013  FINDINGS: Three spot intraoperative radiographic images during ERCP are provided for review.  Initial image demonstrates an ERCP probe overlying the right upper abdominal quadrant with selective cannulation and opacification of the central and mid aspects of the common bile duct which appears moderately dilated. There has been interval removal of previously noted plastic indwelling biliary stent. Re-demonstrated is suspected malignant narrowing/occlusion of the distal aspect of the common bile duct.  Completion image demonstrates placement of a metallic biliary stent overlying the mid and distal aspects of the common bile duct.  There is minimal opacification of the central intrahepatic biliary tree which appears mildly dilated.  IMPRESSION: ERCP with removal of temporary plastic biliary stent and placement of permanent metallic biliary stent traversing a presumed malignant narrowing / occlusion of the distal aspect of the common bile duct.  These images were submitted for radiologic interpretation  only. Please see the procedural report for the amount of contrast and the fluoroscopy time utilized.   Electronically Signed   By: Sandi Mariscal M.D.   On: 12/07/2013 15:38    Scheduled Meds: . brimonidine  1 drop Left Eye Daily  . calcium-vitamin D  1 tablet Oral Q breakfast  . cholecalciferol  1,000 Units Oral Daily  . dorzolamide  1 drop Both Eyes BID  . finasteride  5 mg Oral QHS  . multivitamin with minerals  1 tablet Oral Daily  . omega-3 acid ethyl esters  1 g Oral QHS  . silodosin  4 mg Oral Q breakfast   Continuous Infusions:    Principal Problem:   Syncope Active Problems:   Hyponatremia   Pancreatic mass  Urinary retention   Dyslipidemia   Macular degeneration   Hypotension, unspecified   Hematuria   Bladder outlet obstruction   Urinary tract infection, site not specified    Time spent: > 25 minutes    Kearstyn Avitia  [facility] Pager [phone] If 7PM-7AM, please contact night-coverage at [email or web address], password [facility] 12/07/2013, 8:40 PM  LOS: 6 days

## 2013-12-07 NOTE — Transfer of Care (Signed)
Immediate Anesthesia Transfer of Care Note  Patient: Glenn Patterson  Procedure(s) Performed: Procedure(s): UPPER ENDOSCOPIC ULTRASOUND (EUS) RADIAL (N/A) ENDOSCOPIC RETROGRADE CHOLANGIOPANCREATOGRAPHY (ERCP) WITH PROPOFOL (N/A)  Patient Location: PACU. ENDO  Anesthesia Type:General  Level of Consciousness: Patient easily awoken, sedated, comfortable, cooperative, following commands, responds to stimulation.   Airway & Oxygen Therapy: Patient spontaneously breathing, ventilating well, oxygen via simple oxygen mask.  Post-op Assessment: Report given to PACU RN, vital signs reviewed and stable, moving all extremities.   Post vital signs: Reviewed and stable.  Complications: No apparent anesthesia complications

## 2013-12-07 NOTE — Anesthesia Preprocedure Evaluation (Addendum)
Anesthesia Evaluation  Patient identified by MRN, date of birth, ID band Patient awake    Reviewed: Allergy & Precautions, H&P , NPO status , Patient's Chart, lab work & pertinent test results  Airway Mallampati: II TM Distance: >3 FB Neck ROM: Full    Dental  (+) Caps, Teeth Intact, Dental Advisory Given   Pulmonary neg pulmonary ROS,  breath sounds clear to auscultation  Pulmonary exam normal       Cardiovascular hypertension, Pt. on medications negative cardio ROS  Rhythm:Regular Rate:Normal     Neuro/Psych negative neurological ROS  negative psych ROS   GI/Hepatic negative GI ROS, Neg liver ROS, Pancreatic mass Pancreatic mass   Endo/Other  negative endocrine ROS  Renal/GU negative Renal ROS  negative genitourinary   Musculoskeletal negative musculoskeletal ROS (+)   Abdominal   Peds negative pediatric ROS (+)  Hematology negative hematology ROS (+)   Anesthesia Other Findings   Reproductive/Obstetrics negative OB ROS                         Anesthesia Physical Anesthesia Plan  ASA: III  Anesthesia Plan: General   Post-op Pain Management:    Induction: Intravenous  Airway Management Planned: Oral ETT  Additional Equipment:   Intra-op Plan:   Post-operative Plan: Extubation in OR  Informed Consent: I have reviewed the patients History and Physical, chart, labs and discussed the procedure including the risks, benefits and alternatives for the proposed anesthesia with the patient or authorized representative who has indicated his/her understanding and acceptance.   Dental advisory given  Plan Discussed with: CRNA  Anesthesia Plan Comments:         Anesthesia Quick Evaluation

## 2013-12-07 NOTE — Op Note (Signed)
[facility] [place], [address]   ENDOSCOPIC ULTRASOUND PROCEDURE REPORT  PATIENT: Glenn, Patterson  MR#: [id number] BIRTHDATE: Aug 25, 1923  GENDER: Male ENDOSCOPIST: Milus Banister, MD PROCEDURE DATE:  12/07/2013 PROCEDURE:   Upper EUS w/FNA ASA CLASS:      Class III INDICATIONS:   mass in stomach (biopsy proven to be adenocarcinoma), also mass in head of pancreas causing obstructive jaundice (s/p ERCP with Dr.  Benson Norway 2 weeks ago, placement of plastic biliary stent). MEDICATIONS: General endotracheal anesthesia (GETA)  DESCRIPTION OF PROCEDURE:   After the risks benefits and alternatives of the procedure were  explained, informed consent was obtained. The patient was then placed in the left, lateral, decubitus postion and IV sedation was administered. Throughout the procedure, the patients blood pressure, pulse and oxygen saturations were monitored continuously.  Under direct visualization, the PENTAX EUS SCOPE  endoscope was introduced through the mouth  and advanced to the second portion of the duodenum .  Water was used as necessary to provide an acoustic interface.  Upon completion of the imaging, water was removed and the patient was sent to the recovery room in satisfactory condition.   Endoscopic findings: 1. 5cm mucosal, ulcerated mass in body of stomach, previously known and biopsied by Dr. Benson Norway. 2. The previously placed plastic biliary stent was removed with snare prior to EUS examination.  EUS findings: 1. 2.3cm by 2.2cm fairly well circumscribed hypoechoic mass in head of pancreas. The mass obstructs the CBD and main pancreatic duct but does not involve any nearby signficant vascular structures (does not involve SMA, SMV, PV, celiac trunk).  The mass was sampled with 3 transduodenal passes of a 25 guage EUS FNA needle. 2. No peripancreatic adenopathy. 3. CBD was dilated up to 33mm. 4. Main pancreatic duct dilated to 24mm in  body and abnormal throughout 5. Limited views of liver, spleen were normal.  Impression: -2.3cm by 2.2cm mass in head of pancreas that obstructs the CBD and main pancreatic duct but does not involve any signficant nearby vascular structures.  Preliminary cytology shows neoplastic cells, favoring Islet Cell at this point.  Await final reading.  Current clinical staging: uT2N0Mx (Stage IB). -5cm adenocarcinoma in body of stomach, anatomically remote from the pancreatic tumor described above.   _______________________________ eSigned:  Milus Banister, MD 12/07/2013 2:18 PM   PATIENT NAME:  Glenn Patterson, Glenn Patterson MR#: [id number]

## 2013-12-07 NOTE — Op Note (Signed)
[facility] [place], [id number]   ERCP PROCEDURE REPORT  PATIENT: Glenn, Greulich T.  MR# :[id number] BIRTHDATE: 20-Sep-1923  GENDER: Male ENDOSCOPIST: Milus Banister, MD PROCEDURE DATE:  12/07/2013 PROCEDURE:   ERCP with stent placement ASA CLASS:   Class III INDICATIONS:mass in pancreatic head causing obstructive jaundice. MEDICATIONS: General endotracheal anesthesia (GETA) TOPICAL ANESTHETIC: none  DESCRIPTION OF PROCEDURE:   After the risks benefits and alternatives of the procedure were thoroughly explained, informed consent was obtained.  The Pentax Ercp Scope [id number]  endoscope was introduced through the mouth  and advanced to the second portion of the duodenum without detailed examination of the UGI tract. The major papilla was edematous appearing.  A 44 Autotome over a .035 hydrawire was used to cannulate the bile duct and contrast was injected.  Cholangiogram showed a 3cm long distal CBD stricture and dilated biliary tree proximally (CBD dilated up to 24mm).  A 6cm long, 19mm diameter uncovered SEMS was placed in good position across the stricture. The most distal 1cm of the stent extended into the duodenum.  The main pancreatic duct was never injected or cannulated with wire.  The scope was then completely withdrawn from the patient and the procedure terminated.     COMPLICATIONS: None  ENDOSCOPIC IMPRESSION: 3cm long distal CBD stricture, stented with a 6cm long 34mm diameter UNcovered metal stent.  RECOMMENDATIONS: Follow clinically. He is currently hospitalized with urologic problems and atrial fib.  Can restart blood thinners tomorrow if needed. He is scheduled to meet Dr. Julieanne Manson as outpatient in 7-10 days for gastric adenocarcinoma AND what appears to be a second primary in pancreatic head causing the biliary stricture stented during this procedure.  EUS just prior to this exam suggest the pancreatic mass maybe an  Islet Cell Tumor. Final cytology from the EUS is still pending.  Will put his case on for next week's GI tumor board.   _______________________________ eSignedMilus Banister, MD 12/07/2013 2:59 PM

## 2013-12-07 NOTE — Anesthesia Procedure Notes (Signed)
Procedure Name: Intubation Date/Time: 12/07/2013 12:55 PM Performed by: Ofilia Neas Pre-anesthesia Checklist: Emergency Drugs available, Timeout performed, Suction available, Patient being monitored and Patient identified Patient Re-evaluated:Patient Re-evaluated prior to inductionOxygen Delivery Method: Circle system utilized Preoxygenation: Pre-oxygenation with 100% oxygen Intubation Type: IV induction and Cricoid Pressure applied Ventilation: Mask ventilation without difficulty Laryngoscope Size: Mac and 4 Grade View: Grade I Tube type: Oral Tube size: 7.5 mm Number of attempts: 1 Airway Equipment and Method: Stylet Placement Confirmation: ETT inserted through vocal cords under direct vision,  positive ETCO2 and breath sounds checked- equal and bilateral Secured at: 21 cm Tube secured with: Tape Dental Injury: Teeth and Oropharynx as per pre-operative assessment

## 2013-12-07 NOTE — Progress Notes (Signed)
Day of Surgery  Subjective:  1 - Hematuria, Urinary Retention - Pt with new hematuria culminating with frank clots and retention 12/01/13 at home. Recently DC'd from hospital stay during which stomach and likely pancreatic cancer discovered and pt developed new urinary retention following GI procedure and failed trial of void x several. Only current blood thinner ASA. No prior episodes. CT 10/2013 w/o large GU mass, but does reveal massive prostate. UCX with MRSA, now on Vanc as per CX. Hgb down to 7.6 2/8 and transfused to 8.5 2/9. Bladder irrigation stopped 2/11 as urine continuing to clear and Hgb now trend up.  2 - Massive Prostatic Hypertrophy - 250gm prostate by imaging. Minimal baseline voiding smptoms prior to recent admission. Started finasteride this admission to help reduce prostatic vascularitiy.  Today Glenn Patterson is seen in f/u above. Urine continues to clear, now grossly non-bloody. He had EUS at [facility] earlier today and BX pancreatic lesion.  Objective: Vital signs in last 24 hours: Temp:  [97.4 F (36.3 C)-97.6 F (36.4 C)] 97.4 F (36.3 C) (02/12 1719) Pulse Rate:  [64-73] 69 (02/12 1719) Resp:  [13-26] 16 (02/12 1719) BP: (122-155)/(52-87) 151/52 mmHg (02/12 1719) SpO2:  [86 %-100 %] 97 % (02/12 1719) Last BM Date: 12/06/13  Intake/Output from previous day: 02/11 0701 - 02/12 0700 In: 150 [P.O.:150] Out: 1900 [Urine:1900] Intake/Output this shift: Total I/O In: 2800 [I.V.:2800] Out: 150 [Urine:150]  General appearance: alert, cooperative, appears stated age and family at bedside Head: Normocephalic, without obvious abnormality, atraumatic Eyes: conjunctivae/corneas clear. PERRL, EOM's intact. Fundi benign. Ears: normal TM's and external ear canals both ears Nose: Nares normal. Septum midline. Mucosa normal. No drainage or sinus tenderness. Throat: lips, mucosa, and tongue normal; teeth and gums normal Neck: no adenopathy, no carotid bruit, no JVD, supple, symmetrical,  trachea midline and thyroid not enlarged, symmetric, no tenderness/mass/nodules Back: symmetric, no curvature. ROM normal. No CVA tenderness. Resp: clear to auscultation bilaterally Chest wall: no tenderness Cardio: regular rate and rhythm, S1, S2 normal, no murmur, click, rub or gallop GI: soft, non-tender; bowel sounds normal; no masses,  no organomegaly Male genitalia: normal, foley c/d/i with clear yellow urine, off irrigation. Extremities: extremities normal, atraumatic, no cyanosis or edema Pulses: 2+ and symmetric Skin: Skin color, texture, turgor normal. No rashes or lesions Lymph nodes: Cervical, supraclavicular, and axillary nodes normal. Neurologic: Grossly normal  Lab Results:   Recent Labs  12/06/13 0935 12/07/13 0417  WBC  --  8.8  HGB 9.1* 9.6*  HCT 25.4* 28.1*  PLT  --  387   BMET  Recent Labs  12/05/13 0420 12/07/13 0417  NA 137 136*  K 4.1 4.1  CL 103 100  CO2 26 26  GLUCOSE 100* 107*  BUN 9 11  CREATININE 0.54 0.57  CALCIUM 8.3* 8.9   PT/INR No results found for this basename: LABPROT, INR,  in the last 72 hours ABG No results found for this basename: PHART, PCO2, PO2, HCO3,  in the last 72 hours  Studies/Results: Dg Ercp Biliary & Pancreatic Ducts  12/07/2013   CLINICAL DATA:  Stent placement, pancreatic mass  EXAM: ERCP  TECHNIQUE: Multiple spot images obtained with the fluoroscopic device and submitted for interpretation post-procedure.  COMPARISON:  DG ERCP WO/W SPHINCTEROTOMY dated 11/26/2013; CT ABD/PELVIS W CM dated 11/24/2013  FINDINGS: Three spot intraoperative radiographic images during ERCP are provided for review.  Initial image demonstrates an ERCP probe overlying the right upper abdominal quadrant with selective cannulation and opacification of the  central and mid aspects of the common bile duct which appears moderately dilated. There has been interval removal of previously noted plastic indwelling biliary stent. Re-demonstrated is suspected  malignant narrowing/occlusion of the distal aspect of the common bile duct.  Completion image demonstrates placement of a metallic biliary stent overlying the mid and distal aspects of the common bile duct.  There is minimal opacification of the central intrahepatic biliary tree which appears mildly dilated.  IMPRESSION: ERCP with removal of temporary plastic biliary stent and placement of permanent metallic biliary stent traversing a presumed malignant narrowing / occlusion of the distal aspect of the common bile duct.  These images were submitted for radiologic interpretation only. Please see the procedural report for the amount of contrast and the fluoroscopy time utilized.   Electronically Signed   By: Sandi Mariscal M.D.   On: 12/07/2013 15:38    Anti-infectives: Anti-infectives   Start     Dose/Rate Route Frequency Ordered Stop   12/04/13 1300  vancomycin (VANCOCIN) 1,250 mg in sodium chloride 0.9 % 250 mL IVPB  Status:  Discontinued     1,250 mg 166.7 mL/hr over 90 Minutes Intravenous Every 24 hours 12/04/13 1219 12/06/13 1150   12/02/13 2000  cefTRIAXone (ROCEPHIN) 1 g in dextrose 5 % 50 mL IVPB  Status:  Discontinued     1 g 100 mL/hr over 30 Minutes Intravenous Every 24 hours 12/01/13 2215 12/04/13 1220   12/01/13 2130  cefTRIAXone (ROCEPHIN) 1 g in dextrose 5 % 50 mL IVPB     1 g 100 mL/hr over 30 Minutes Intravenous  Once 12/01/13 2130 12/01/13 2332      Assessment/Plan:  1 - Hematuria, Urinary Retention - likely from large prostate, now improving significantly and off irrigation. Keep current foley for discharge. We will arrange trial of void as outpatient at Urology office.  2 - Massive Prostatic Hypertrophy - continue finasteride, this will take some time for effect. He will need to continue this at discharge.  NO contraindications for discharge from GU perspective.   [facility], Glenn Patterson 12/07/2013

## 2013-12-07 NOTE — Anesthesia Postprocedure Evaluation (Signed)
  Anesthesia Post-op Note  Patient: Glenn Patterson  Procedure(s) Performed: Procedure(s) (LRB): UPPER ENDOSCOPIC ULTRASOUND (EUS) RADIAL (N/A) ENDOSCOPIC RETROGRADE CHOLANGIOPANCREATOGRAPHY (ERCP) WITH PROPOFOL (N/A)  Patient Location: PACU  Anesthesia Type: General  Level of Consciousness: awake and alert   Airway and Oxygen Therapy: Patient Spontanous Breathing  Post-op Pain: mild  Post-op Assessment: Post-op Vital signs reviewed, Patient's Cardiovascular Status Stable, Respiratory Function Stable, Patent Airway and No signs of Nausea or vomiting  Last Vitals:  Filed Vitals:   12/07/13 1530  BP: 142/87  Pulse:   Temp:   Resp: 18    Post-op Vital Signs: stable   Complications: No apparent anesthesia complications

## 2013-12-08 ENCOUNTER — Encounter (HOSPITAL_COMMUNITY): Payer: Self-pay | Admitting: Gastroenterology

## 2013-12-08 LAB — COMPREHENSIVE METABOLIC PANEL
ALT: 268 U/L — ABNORMAL HIGH (ref 0–53)
AST: 176 U/L — ABNORMAL HIGH (ref 0–37)
Albumin: 2.5 g/dL — ABNORMAL LOW (ref 3.5–5.2)
Alkaline Phosphatase: 749 U/L — ABNORMAL HIGH (ref 39–117)
BUN: 10 mg/dL (ref 6–23)
CALCIUM: 9 mg/dL (ref 8.4–10.5)
CO2: 25 meq/L (ref 19–32)
CREATININE: 0.54 mg/dL (ref 0.50–1.35)
Chloride: 99 mEq/L (ref 96–112)
GLUCOSE: 143 mg/dL — AB (ref 70–99)
Potassium: 4.4 mEq/L (ref 3.7–5.3)
Sodium: 134 mEq/L — ABNORMAL LOW (ref 137–147)
Total Bilirubin: 2.2 mg/dL — ABNORMAL HIGH (ref 0.3–1.2)
Total Protein: 6 g/dL (ref 6.0–8.3)

## 2013-12-08 MED ORDER — FINASTERIDE 5 MG PO TABS: 5.0000 mg | ORAL_TABLET | Freq: Every day | ORAL | Status: AC

## 2013-12-08 MED ORDER — OXYCODONE HCL 5 MG PO TABS: 5.0000 mg | ORAL_TABLET | ORAL | Status: AC | PRN

## 2013-12-12 ENCOUNTER — Telehealth: Payer: Self-pay | Admitting: Oncology

## 2013-12-12 ENCOUNTER — Encounter: Payer: Self-pay | Admitting: Oncology

## 2013-12-12 ENCOUNTER — Ambulatory Visit (HOSPITAL_BASED_OUTPATIENT_CLINIC_OR_DEPARTMENT_OTHER): Payer: Medicare HMO | Admitting: Oncology

## 2013-12-12 ENCOUNTER — Ambulatory Visit: Payer: Medicare HMO

## 2013-12-12 VITALS — BP 126/55 | HR 64 | Temp 97.5°F | Resp 18 | Ht 68.0 in | Wt 157.7 lb

## 2013-12-12 DIAGNOSIS — R17 Unspecified jaundice: Secondary | ICD-10-CM

## 2013-12-12 DIAGNOSIS — N138 Other obstructive and reflux uropathy: Secondary | ICD-10-CM

## 2013-12-12 DIAGNOSIS — C169 Malignant neoplasm of stomach, unspecified: Secondary | ICD-10-CM | POA: Insufficient documentation

## 2013-12-12 DIAGNOSIS — N401 Enlarged prostate with lower urinary tract symptoms: Secondary | ICD-10-CM

## 2013-12-12 DIAGNOSIS — R319 Hematuria, unspecified: Secondary | ICD-10-CM

## 2013-12-12 DIAGNOSIS — C259 Malignant neoplasm of pancreas, unspecified: Secondary | ICD-10-CM

## 2013-12-12 DIAGNOSIS — C250 Malignant neoplasm of head of pancreas: Secondary | ICD-10-CM

## 2013-12-12 DIAGNOSIS — D500 Iron deficiency anemia secondary to blood loss (chronic): Secondary | ICD-10-CM

## 2013-12-12 DIAGNOSIS — I4891 Unspecified atrial fibrillation: Secondary | ICD-10-CM

## 2013-12-12 DIAGNOSIS — K8689 Other specified diseases of pancreas: Secondary | ICD-10-CM

## 2013-12-12 DIAGNOSIS — C161 Malignant neoplasm of fundus of stomach: Secondary | ICD-10-CM

## 2013-12-12 HISTORY — DX: Malignant neoplasm of pancreas, unspecified: C25.9

## 2013-12-12 NOTE — Progress Notes (Signed)
Glenn Patterson   Referring MD: Trashawn Oquendo 80 y.o.  Dec 23, 1922    Reason for Referral: Gastric and Pancreas cancers   HPI:   Glenn Patterson reports developing jaundice and pruritus. Glenn Patterson saw Glenn Patterson and was noted to have elevated liver enzymes. Glenn Patterson was referred for a CT of the abdomen and pelvis on 11/24/2013. Intrahepatic and extrahepatic delirium.dilatation was noted. There was an abrupt transition at the pancreatic head. A hypoattenuation lesion was noted in the head of the pancreas measuring 13 x 12 mm. The gallbladder was distended. No focal hepatic lesion. A rounded masslike lesion was noted in the fundus of the stomach measuring 4 x 3.2 cm. No lymphadenopathy. The prostate gland was enlarged.  Glenn Patterson was admitted to the hospital and taken to an ERCP procedure by Dr. Benson Norway. A bile duct stricture was noted and a stent was inserted. A friable firm mass was noted in the fundus of the stomach. Multiple biopsies were obtained. 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  Glenn Patterson developed urinary retention while in the hospital and a Foley catheter was placed. Glenn Patterson was discharged on 11/30/2013 and then readmitted on 12/02/2013 after a syncope event while on the toilet. Glenn Patterson had developed urinary retention and pain with the Foley catheter in place. Urology was consulted and placed a new Foley catheter and Glenn Patterson began bladder irrigation. Clots were evacuated. Glenn Patterson was placed on finasteride. A Foley cath remains in place and Glenn Patterson is scheduled to see Dr. Tresa Patterson next week.  Glenn Patterson was taken to an ERCP procedure on 12/07/2013 by Dr. Ardis Hughs. A metal stent was placed. An EUS procedure on the same day confirmed a 5 cm ulcerated mass in the body of the stomach. A 2.3 x 2.2 cm hypoechoic mass was seen in the head of the pancreas with obstruction of the common bile duct and main pancreatic duct. No vascular involvement.  The mass was biopsied. No peripancreatic adenopathy. The cytology ([id number])  confirmed malignant cells consistent with adenocarcinoma.  The jaundice and pruritus have resolved.    Past Medical History  Diagnosis Date  . Hypertension   . Hypercholesteremia   . Pancreatic cancer (uT2uN0)   12/07/2013   . Glaucoma   . Macular degeneration     .   Gastric cancer-fundus mass, adenocarcinoma                                     11/26/2013  Past Surgical History  Procedure Laterality Date  . Ercp N/A 11/26/2013    Procedure: ENDOSCOPIC RETROGRADE CHOLANGIOPANCREATOGRAPHY (ERCP);  Surgeon: Beryle Beams, MD;  Location: [facility] ENDOSCOPY;  Service: Endoscopy;  Laterality: N/A;  . Eus N/A 12/07/2013    Procedure: UPPER ENDOSCOPIC ULTRASOUND (EUS) RADIAL;  Surgeon: Milus Banister, MD;  Location: [facility] ENDOSCOPY;  Service: Endoscopy;  Laterality: N/A;  . Endoscopic retrograde cholangiopancreatography (ercp) with propofol N/A 12/07/2013    Procedure: ENDOSCOPIC RETROGRADE CHOLANGIOPANCREATOGRAPHY (ERCP) WITH PROPOFOL;  Surgeon: Milus Banister, MD;  Location: [facility] ENDOSCOPY;  Service: Endoscopy;  Laterality: N/A;    .    Bilateral inguinal hernia repair  Family history: Glenn Patterson is an only child. One daughter. No family history of cancer.  Current outpatient prescriptions:benazepril (LOTENSIN) 10 MG tablet, Take 10 mg by mouth daily., Disp: , Rfl: ;  brimonidine (ALPHAGAN P) 0.1 % SOLN, Place 1  drop into the left eye daily., Disp: , Rfl: ;  calcium citrate-vitamin D (CITRACAL+D) 315-200 MG-UNIT per tablet, Take 1 tablet by mouth daily., Disp: , Rfl: ;  Cholecalciferol (VITAMIN D-3) 1000 UNITS CAPS, Take 1,000 Units by mouth daily. , Disp: , Rfl:  dorzolamide (TRUSOPT) 2 % ophthalmic solution, Place 1 drop into both eyes 2 (two) times daily., Disp: , Rfl: ;  finasteride (PROSCAR) 5 MG tablet, Take 1 tablet (5 mg total) by mouth at bedtime., Disp: 30 tablet, Rfl: 0;  metroNIDAZOLE (FLAGYL) 500 MG tablet, Pt takes 3  times a day.  Started 12/11/13, Disp: , Rfl: ;  Multiple Vitamin (MULTIVITAMIN WITH MINERALS) TABS tablet, Take 1 tablet by mouth daily., Disp: , Rfl:  Omega-3 Fatty Acids (OMEGA 3 PO), Take 1 capsule by mouth at bedtime., Disp: , Rfl: ;  OVER THE COUNTER MEDICATION, Take 1 tablet by mouth 2 (two) times daily. Macular Degeneration supplement, Disp: , Rfl: ;  oxyCODONE (OXY IR/ROXICODONE) 5 MG immediate release tablet, Take 1 tablet (5 mg total) by mouth every 4 (four) hours as needed for moderate pain or severe pain., Disp: 30 tablet, Rfl: 0 tamsulosin (FLOMAX) 0.4 MG CAPS capsule, Take 1 capsule (0.4 mg total) by mouth 2 (two) times daily., Disp: 30 capsule, Rfl: 0  Allergies: No Known Allergies  Social History: Glenn Patterson lives with his wife in [place]. 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 Glenn Patterson worked on the [employer]. No tobacco use. Glenn Patterson drinks beer occasionally. Glenn Patterson was transfused 12/03/2013. No risk factor for HIV or hepatitis.  ROS:   Positives include: Jaundice and pruritus prior to placement of the bile duct stent, anorexia, 4 pound weight loss, diarrhea since discharge from the hospital-now taking Flagyl, Foley catheter in place  A complete ROS was otherwise negative.  Physical Exam:  Blood pressure 126/55, pulse 64, temperature 97.5 F (36.4 C), temperature source Oral, resp. rate 18, height 5\' 8"  (1.727 m), weight 157 lb 11.2 oz (71.532 kg), SpO2 99.00%.  HEENT: Oropharynx without visible mass, neck without mass Lungs: Clear bilaterally Cardiac: Irregular GI: No hepatosplenomegaly, no apparent ascites, nontender, no mass GU: Uncircumcised male, Foley catheter in place, testes without mass  Vascular: Trace pretibial edema at the right greater than left lower leg. No erythema or tenderness Lymph nodes: No cervical, supraclavicular, axillary, or inguinal nodes Neurologic: Alert and oriented, the motor exam appears intact in the upper and lower extremities Skin: Multiple benign appearing moles  over the tongue, no jaundice Musculoskeletal: No spine tenderness   LAB:  CBC  Lab Results  Component Value Date   WBC 8.8 12/07/2013   HGB 9.6* 12/07/2013   HCT 28.1* 12/07/2013   MCV 92.1 12/07/2013   PLT 387 12/07/2013   NEUTROABS 6.8 12/01/2013     CMP      Component Value Date/Time   NA 134* 12/08/2013 0510   K 4.4 12/08/2013 0510   CL 99 12/08/2013 0510   CO2 25 12/08/2013 0510   GLUCOSE 143* 12/08/2013 0510   BUN 10 12/08/2013 0510   CREATININE 0.54 12/08/2013 0510   CALCIUM 9.0 12/08/2013 0510   PROT 6.0 12/08/2013 0510   ALBUMIN 2.5* 12/08/2013 0510   AST 176* 12/08/2013 0510   ALT 268* 12/08/2013 0510   ALKPHOS 749* 12/08/2013 0510   BILITOT 2.2* 12/08/2013 0510   GFRNONAA >90 12/08/2013 0510   GFRAA >90 12/08/2013 0510   CA 19-9 on 11/25/2013-5.0  Radiology: As per history of present illness, chest x-ray 12/01/2013-no active disease.  Assessment/Plan:   1. Adenocarcinoma the pancreas, pancreas head mass, clinical stage IB,uT2N0.  2. Obstructive jaundice secondary to #1, status post placement of a metal biliary stent 12/07/2013  3. Gastric fundus cancer, adenocarcinoma with signet and mucinous features on an endoscopic biopsy 11/26/2013  4. Prostatic hypertrophy with urinary retention-followed by Dr. Tresa Patterson  5. Anemia secondary to urinary bleeding after placement of multiple Foley catheters  6. History of hypertension  7. Hyperlipidemia  8. irregular heart rate-atrial fibrillation on EKG's during the February 2015 hospital admission    Disposition:   Glenn Patterson has been diagnosed with pancreas cancer after presenting with obstructive jaundice. His symptoms were palliated with placement of a bile duct stent. Glenn Patterson also appears to have a distinct primary gastric tumor. Glenn Patterson appears asymptomatic from the gastric cancer.  I discussed the diagnosis of gastric and pancreas cancer with Glenn Patterson and his family. Glenn Patterson understands the only curative therapy is surgical  resection. Glenn Patterson does not appear to have distant metastatic disease or local invasion that would preclude resection. However these procedures could be associated with significant morbidity.  My initial impression is to recommend observation. His chief complaint  at present is related to the prostatic hypertrophy and Foley catheter. Glenn Patterson is scheduled to see Dr. Tresa Patterson next week.  We will present his case at the GI tumor conference on 12/13/2013. Glenn Patterson will return for an office visit here in approximately one month. We can consider palliative chemotherapy and radiation if Glenn Patterson develops symptoms related to the gastric or pancreatic tumors.  [place], [place] 12/12/2013, 12:58 PM

## 2013-12-12 NOTE — Telephone Encounter (Signed)
Gave pt appt for MD only for March 2015

## 2013-12-12 NOTE — Progress Notes (Signed)
Met with Eliezer Bottom and family. Explained role of nurse navigator. Educational information provided on stomach and pancreas cancer  Referral made to dietician for diet education. [facility] resources provided to patient, including SW service information.  Contact names and phone numbers were provided for entire [facility] team.  Teach back method was used. Will continue to follow as needed at next MD visit.

## 2013-12-17 NOTE — Discharge Summary (Signed)
Physician Discharge Summary  Glenn Patterson JJK:[id number] DOB: Apr 03, 1923 DOA: 12/01/2013  PCP: Jerlyn Ly, MD  Admit date: 12/01/2013 Discharge date: 12/08/2013  Time spent: 40 minutes  Recommendations for Outpatient Follow-up:  1. PCP in 1 week  Discharge Diagnoses:  Principal Problem:   Syncope Active Problems:   Hyponatremia   Pancreatic mass   Urinary retention   Dyslipidemia   Macular degeneration   Hypotension, unspecified   Hematuria   Bladder outlet obstruction   Urinary tract infection, site not specified   Discharge Condition: stable  Diet recommendation: regular  Filed Weights   12/01/13 2359 12/06/13 0506 12/08/13 0538  Weight: 70.716 kg (155 lb 14.4 oz) 71.623 kg (157 lb 14.4 oz) 69.718 kg (153 lb 11.2 oz)    History of present illness:  Glenn Patterson is a 80 y.o. male with a recently diagnosed pancreatic mass found during a hospitalization from 02/ until 02/065/2015 for Obstructive Jaundice and Urinary Retention who returns to the ED after a syncopal event this evening when he was trying to pass his urine. He had a foley catheter placed while he was hospitialised due to urinary retention, and while he was home he began to have hematuria overnight and into the day, then he was not able to pass any urine into the catheter and he was becoming increasing uncomfortable by the afternoon. He went into the bathroom and tried to sit on the toilet to see if that would help him pass his urine through the catheter but the urine still would not pass. He then reached out for the towel rack to hold on to as he stood up and he passed out. When he came to, he was incoherent and confused and did not recognize his family for a few minutes and they subsequently called EMS.    Hospital Course:  80 year old admitted for syncope with recent diagnosis of pancreatic mass undergoing GI workup for further evaluation. Presented to the hospital with hematuria and syncope   1.  Syncope- Multifactorial, due to Orthostatic Hypotension, and Vaso-Vagal Response   -no events on telemetry  2. Hypotension  - Resolved with hydration -resumed Benazepril,   3. Hematuria/urinary retention  - due to BPH/+UTI, and/or Foley trauma ,  - Urology Dr Tresa Moore following, was started on CBIs which were subsequently discontinued when urine cleared stopped CBI yesterday,  -able to void independently, needs close Fu with urology -now on Rapaflo  -Massive Prostatic Hypertrophy - 250gm prostate by imaging. . Started finasteride per Urology  to help reduce prostatic vascularitiy. 4. Bladder Outlet Obstruction  - as above   5. UTI unlikely  - 9K colonies of Coag negative staph, likely colonization from recent instrumentation  -was briefly on IV Vanc, this was soon stopped  6. Hyponatremia  - resolved, discontinued HCTZ.  - Most likely due to poor oral intake.   7. Pancreatic Mass- S/P Biliary Stent placed with Elevated LFTs  -s/p ERCP/EUS with stent exchange per Dr.Gessnar yesterday  -prelim biopsy s/o Islet cell tumor  -Fu with Dr.sherril for further options  8.  Dyslipidemia- Continue Omega 3 Fatty Acids daily.   9. Macular Degeneration/ and Glaucoma- Continue Alphagan and Trusopt Ophthalmic Drops as directed.     Consultations:  Oncology  Discharge Exam: Filed Vitals:   12/08/13 0736  BP: 152/59  Pulse: 79  Temp: 97.8 F (36.6 C)  Resp: 15    General: AAOx3 Cardiovascular: S1S2/RRR Respiratory:CTAB  Discharge Instructions  Discharge Orders   Future Appointments Provider Department  Dept Phone   01/10/2014 9:30 AM Ladell Pier, MD [facility] Medical Oncology [phone]   Future Orders Complete By Expires   Diet - low sodium heart healthy  As directed    Increase activity slowly  As directed        Medication List    STOP taking these medications       aspirin 81 MG tablet      TAKE these medications       benazepril 10 MG  tablet  Commonly known as:  LOTENSIN  Take 10 mg by mouth daily.     brimonidine 0.1 % Soln  Commonly known as:  ALPHAGAN P  Place 1 drop into the left eye daily.     calcium citrate-vitamin D 315-200 MG-UNIT per tablet  Commonly known as:  CITRACAL+D  Take 1 tablet by mouth daily.     dorzolamide 2 % ophthalmic solution  Commonly known as:  TRUSOPT  Place 1 drop into both eyes 2 (two) times daily.     finasteride 5 MG tablet  Commonly known as:  PROSCAR  Take 1 tablet (5 mg total) by mouth at bedtime.     multivitamin with minerals Tabs tablet  Take 1 tablet by mouth daily.     OMEGA 3 PO  Take 1 capsule by mouth at bedtime.     OVER THE COUNTER MEDICATION  Take 1 tablet by mouth 2 (two) times daily. Macular Degeneration supplement     oxyCODONE 5 MG immediate release tablet  Commonly known as:  Oxy IR/ROXICODONE  Take 1 tablet (5 mg total) by mouth every 4 (four) hours as needed for moderate pain or severe pain.     tamsulosin 0.4 MG Caps capsule  Commonly known as:  FLOMAX  Take 1 capsule (0.4 mg total) by mouth 2 (two) times daily.     Vitamin D-3 1000 UNITS Caps  Take 1,000 Units by mouth daily.       No Known Allergies     Follow-up Information   Follow up with Alexis Frock, MD. Schedule an appointment as soon as possible for a visit in 1 week.   Specialty:  Urology   Contact information:   [facility]  [place] [address] [phone]       Follow up with Betsy Coder, MD On 12/12/2013.   Specialty:  Oncology   Contact information:   [place] [address] [phone]       Follow up with Blood work, CBC and Bmet. (On Tuesday at [place] office)        The results of significant diagnostics from this hospitalization (including imaging, microbiology, ancillary and laboratory) are listed below for reference.    Significant Diagnostic Studies: Ct Abdomen Pelvis W  Contrast  11/24/2013   CLINICAL DATA:  Elevated bilirubin and lipase.  Jaundice  EXAM: CT ABDOMEN AND PELVIS WITH CONTRAST  TECHNIQUE: Multidetector CT imaging of the abdomen and pelvis was performed using the standard protocol following bolus administration of intravenous contrast.  CONTRAST:  164mL OMNIPAQUE IOHEXOL 300 MG/ML  SOLN  COMPARISON:  None.  FINDINGS: Lung bases are clear.  No pericardial fluid.  There is intrahepatic and extrahepatic biliary duct dilatation. The common bile duct measures 16 mm up to the pancreatic head. There is a abrupt transition at the pancreatic head. There is mild dilatation of the pancreatic duct to 5 mm. There is a potential relative hypo  attenuating lesion within the head of the pancreas measuring 13 by 12 mm (image 40, series 2). The gallbladder is distended up to 4.9 cm. No inflammation evident. No focal hepatic lesion. The pancreas itself is atrophic.  The spleen, adrenal glands, kidneys demonstrate no acute findings. There parapelvic cysts in the kidneys.  The stomach contains a rounded masslike lesion in the fundus measuring 4.0 x 3.2 cm (image 26, series 2). This appears to emanate from the gastric mucosa wiht central calcifications. The more distal stomach is normal. The small bowel colon are unremarkable.  Abdominal aorta is normal caliber. No retroperitoneal periportal lymphadenopathy.  No free fluid the pelvis. The prostate gland is enlarged. Bladder is normal. No pelvic lymphadenopathy. Insert negative then  IMPRESSION: 1. Intrahepatic and extrahepatic biliary duct dilatation with marked dilatation of the common bile duct up to the pancreatic head. Potential mass lesion within the pancreatic head. Recommend a ERCP/EUS for relief of the biliary obstruction and sampling of the potential pancreatic head lesion. 2. Mild dilatation of the pancreatic duct and associated pancreatic atrophy would also be consistent with a pancreatic head mass lesion. Recommend correlation  with tumor marker CA 19 9. 3. There is a is potential polypoid 3 cm lesion extending from the gastric mucosa of the stomach fundus. This could represent a primary gastric neoplasm. Recommend optical evaluation at the same time as the above recommended ERCP/EUS. 4. Prostate hypertrophy.   Electronically Signed   By: Suzy Bouchard M.D.   On: 11/24/2013 23:52   Dg Chest Portable 1 View  12/01/2013   CLINICAL DATA:  Hematuria.  Loss of consciousness.  Fall.  EXAM: PORTABLE CHEST - 1 VIEW  COMPARISON:  12/07/2003  FINDINGS: The heart size and mediastinal contours are within normal limits. Both lungs are clear. The visualized skeletal structures are unremarkable.  IMPRESSION: No active disease.   Electronically Signed   By: Rolm Baptise M.D.   On: 12/01/2013 18:39   Dg Ercp Biliary & Pancreatic Ducts  12/07/2013   CLINICAL DATA:  Stent placement, pancreatic mass  EXAM: ERCP  TECHNIQUE: Multiple spot images obtained with the fluoroscopic device and submitted for interpretation post-procedure.  COMPARISON:  DG ERCP WO/W SPHINCTEROTOMY dated 11/26/2013; CT ABD/PELVIS W CM dated 11/24/2013  FINDINGS: Three spot intraoperative radiographic images during ERCP are provided for review.  Initial image demonstrates an ERCP probe overlying the right upper abdominal quadrant with selective cannulation and opacification of the central and mid aspects of the common bile duct which appears moderately dilated. There has been interval removal of previously noted plastic indwelling biliary stent. Re-demonstrated is suspected malignant narrowing/occlusion of the distal aspect of the common bile duct.  Completion image demonstrates placement of a metallic biliary stent overlying the mid and distal aspects of the common bile duct.  There is minimal opacification of the central intrahepatic biliary tree which appears mildly dilated.  IMPRESSION: ERCP with removal of temporary plastic biliary stent and placement of permanent metallic  biliary stent traversing a presumed malignant narrowing / occlusion of the distal aspect of the common bile duct.  These images were submitted for radiologic interpretation only. Please see the procedural report for the amount of contrast and the fluoroscopy time utilized.   Electronically Signed   By: Sandi Mariscal M.D.   On: 12/07/2013 15:38   Dg Ercp Biliary & Pancreatic Ducts  11/26/2013   CLINICAL DATA:  ERCP for pancreatic mass.  EXAM: ERCP  TECHNIQUE: Multiple spot images obtained with the fluoroscopic device  and submitted for interpretation post-procedure.  COMPARISON:  CT, 11/24/2013.  FINDINGS: Two images from an ERCP show cannulization of the common bile duct with subsequent placement of a biliary stent. The proximal duct appears irregular and narrowed consistent with a stricture. The distal duct is dilated. No duct filling defect is seen on the images submitted.  IMPRESSION: ERCP images as described.  These images were submitted for radiologic interpretation only. Please see the procedural report for the amount of contrast and the fluoroscopy time utilized.   Electronically Signed   By: Lajean Manes M.D.   On: 11/26/2013 10:37    Microbiology: No results found for this or any previous visit (from the past 240 hour(s)).   Labs: Basic Metabolic Panel: No results found for this basename: NA, K, CL, CO2, GLUCOSE, BUN, CREATININE, CALCIUM, MG, PHOS,  in the last 168 hours Liver Function Tests: No results found for this basename: AST, ALT, ALKPHOS, BILITOT, PROT, ALBUMIN,  in the last 168 hours No results found for this basename: LIPASE, AMYLASE,  in the last 168 hours No results found for this basename: AMMONIA,  in the last 168 hours CBC: No results found for this basename: WBC, NEUTROABS, HGB, HCT, MCV, PLT,  in the last 168 hours Cardiac Enzymes: No results found for this basename: CKTOTAL, CKMB, CKMBINDEX, TROPONINI,  in the last 168 hours BNP: BNP (last 3 results) No results found for  this basename: PROBNP,  in the last 8760 hours CBG: No results found for this basename: GLUCAP,  in the last 168 hours     Signed:  Dabid Godown  [facility] 12/17/2013, 9:25 PM

## 2013-12-22 ENCOUNTER — Telehealth: Payer: Self-pay | Admitting: *Deleted

## 2013-12-22 NOTE — Telephone Encounter (Signed)
Patient's wife called and stated her husband is having pain in his right side and that he can hardly get out of bed  She stated the patient had no pain at MD visit on 12/12/13, but this has now changed.  Per Dr. Benay Spice, patient is to take the prescribed oxycodone 5mg  tablet by mouth every 4 hours as needed for pain.  If pain does not improve, Dr. Benay Spice advised to take patient to the ED.  Patient's wife verbalized comprehension.

## 2013-12-25 ENCOUNTER — Telehealth: Payer: Self-pay | Admitting: *Deleted

## 2013-12-25 NOTE — Telephone Encounter (Signed)
Called back to give update as MD had requested: Pain continues, but seems to be controlled with the oxycodone. Still has cath (doubts he'll ever be able to get rid of it). Feet and legs are swelling up to his knees (R>L). Hospice Palliative nurse for [facility] in home now. Constipated, but the nurse is working on this for him now. Requested she have nurse call office in am with her assessment and recommendations if not urgent. Dr. Benay Spice notified.

## 2013-12-26 ENCOUNTER — Telehealth: Payer: Self-pay | Admitting: *Deleted

## 2013-12-26 NOTE — Telephone Encounter (Signed)
Call from [place], [facility] RN. Saw pt yesterday for first visit. No needs at this time.

## 2014-01-09 ENCOUNTER — Telehealth: Payer: Self-pay | Admitting: *Deleted

## 2014-01-09 NOTE — Telephone Encounter (Addendum)
Received phone call from patient's wife.  She stated she had to cancel the appointment with Dr. Benay Spice for tomorrow.  She was upset because the scheduler told her she could not get another appointment until May.  "My husband is sick and can hardly hold his head up" she stated.  She was distressed that May was the earliest appointment.  She stated the hospice nurse was going to call her tonight.  She stated the hospice nurse was coming to the house tomorrow to check her husband.  She stated her husband was drinking some fluids but not eating much at all.  She stated he was no worse than yesterday, but no better either.  This RN advised her to speak to hospice nurse tonight.  This RN explained that hospice nurse could call MD  after patient seen.  This nurse reminded patient's wife to call [phone] if she felt like he needed medical attention before hospice RN could see patient.  She then had to get off the phone because she said hospice nurse was calling her.  This RN asked to call back if further assistance or advice was needed.  She verbalized comprehension.  MD informed.

## 2014-01-10 ENCOUNTER — Ambulatory Visit: Payer: Medicare HMO | Admitting: Oncology

## 2014-01-10 ENCOUNTER — Telehealth: Payer: Self-pay | Admitting: *Deleted

## 2014-01-10 DIAGNOSIS — C259 Malignant neoplasm of pancreas, unspecified: Secondary | ICD-10-CM

## 2014-01-10 DIAGNOSIS — C169 Malignant neoplasm of stomach, unspecified: Secondary | ICD-10-CM

## 2014-01-10 NOTE — Telephone Encounter (Signed)
Called to request her Wednesday appointment back. Made her aware that Dr. Benay Spice has no openings today. Offered appointment for tomorrow at 12:00 and she agrees. Has people "who know what they're doing" to bring him here tomorrow.

## 2014-01-10 NOTE — Telephone Encounter (Signed)
Discussed patient status with Hospice RN: Patient uncomfortable and prefers not to come in tomorrow. Would probably require ambulance transport. She is asking if he really needs to be seen by Dr. Benay Spice? Hospice RN has instructed wife to increase his oxyir to 10 mg every 4 hours while awake. She will revisit on Friday to ensure he is more comfortable. Per Dr. Benay Spice : He will not be appropriate for palliative RT/chemo, so he does not need to come in. Be sure wife is aware that [facility] is available if caring for him in home too difficult. [employer] and [employer] officer, [employer] have already made them aware. Confirmed he is a DNR. Will cancel visit tomorrow.

## 2014-01-11 ENCOUNTER — Ambulatory Visit: Payer: Medicare HMO | Admitting: Oncology

## 2014-06-26 DEATH — deceased

## 2015-11-29 IMAGING — CT CT ABD-PELV W/ CM
2 of 5 series · 15 of 46 positions shown, 17 images · IV contrast (APPLIED)
Comparison: None.

CLINICAL DATA: Elevated bilirubin and lipase.  Jaundice

EXAM:
CT ABDOMEN AND PELVIS WITH CONTRAST
TECHNIQUE: Multidetector CT imaging of the abdomen and pelvis was performed
using the standard protocol following bolus administration of
intravenous contrast.
CONTRAST:  100mL OMNIPAQUE IOHEXOL 300 MG/ML  SOLN

[Series 2: abd/ pelvis 5.0 i30f 1 · axial · 0.76mm/px · z∈[-508,-63]mm · 12 of 101 slices shown, 14 images]
[im 6/101  soft-tissue]
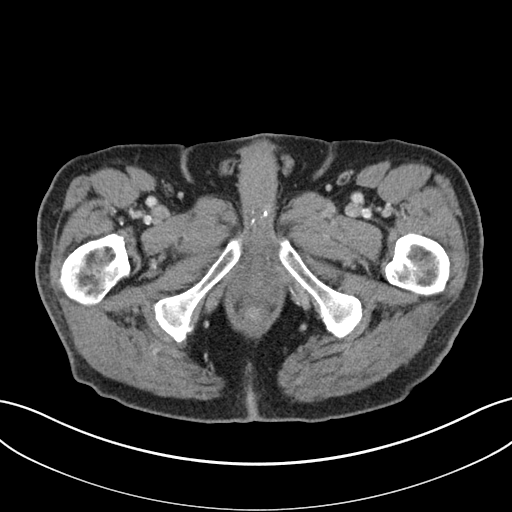
[im 6/101  bone]
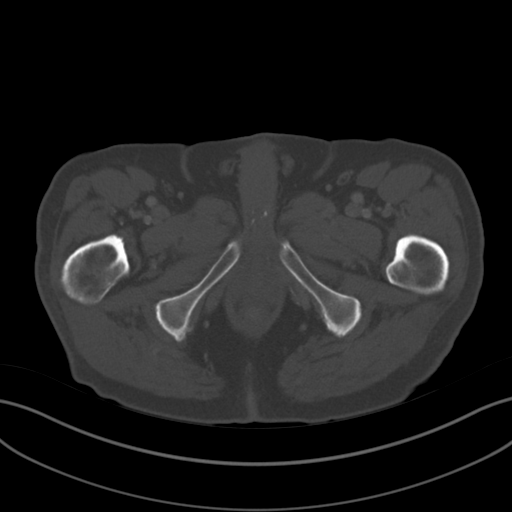
[im 16/101  soft-tissue]
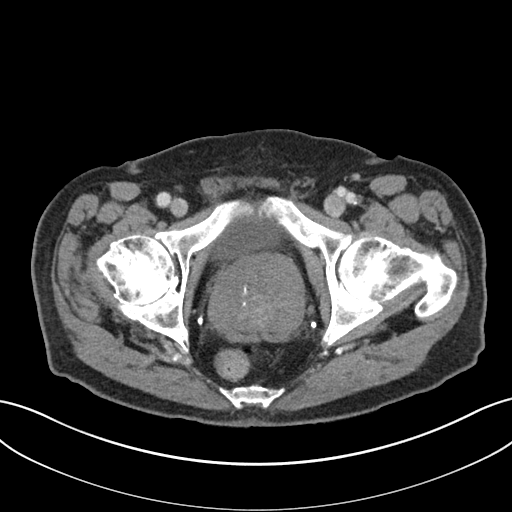
[im 22/101  soft-tissue]
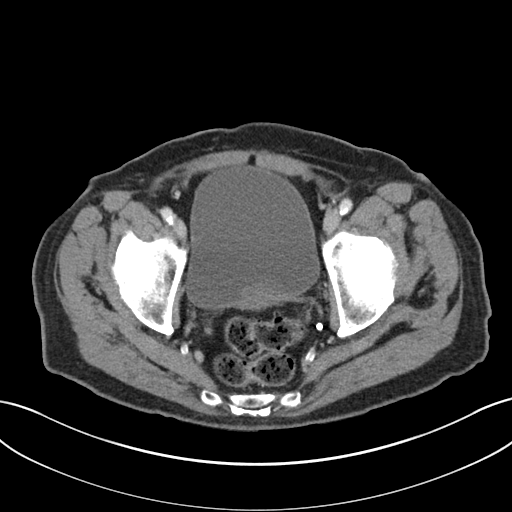
[im 32/101  soft-tissue]
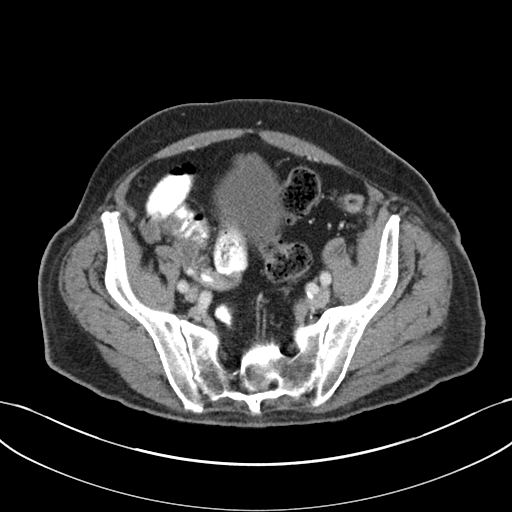
[im 37/101  soft-tissue]
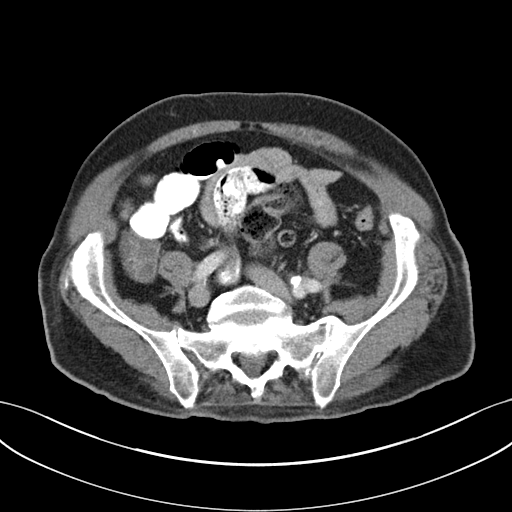
[im 48/101  soft-tissue]
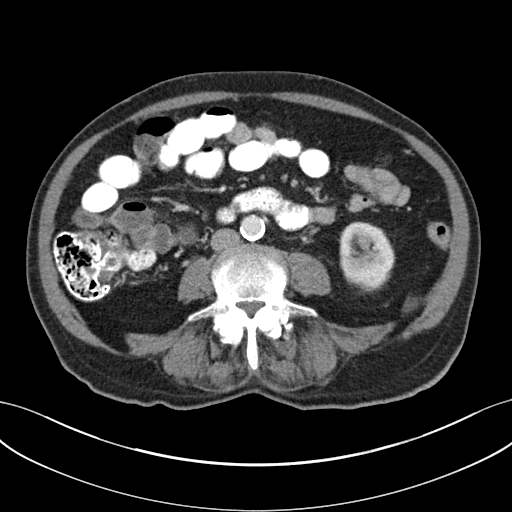
[im 53/101  soft-tissue]
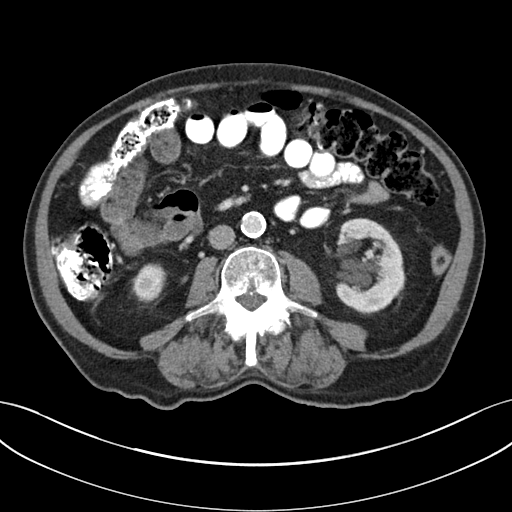
[im 64/101  soft-tissue]
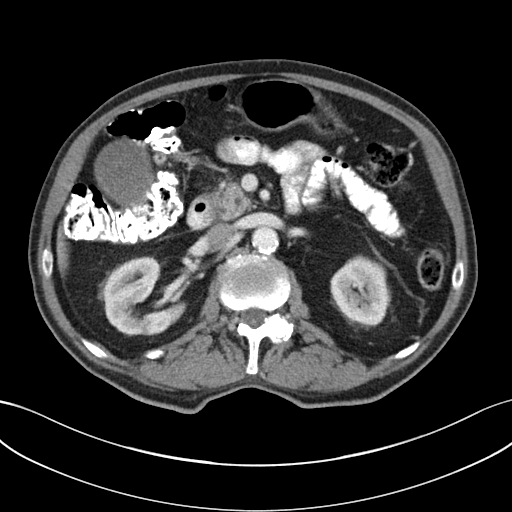
[im 69/101  soft-tissue]
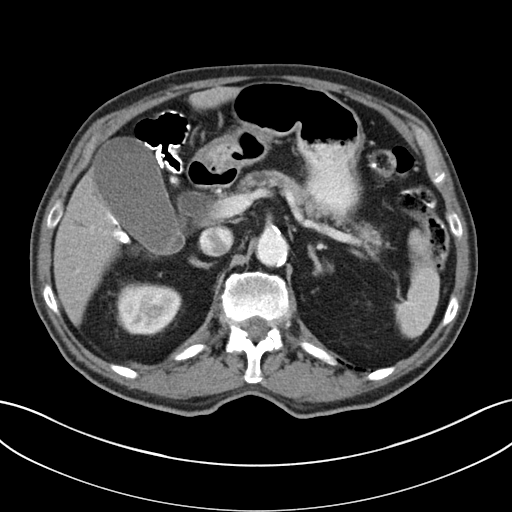
[im 69/101  bone]
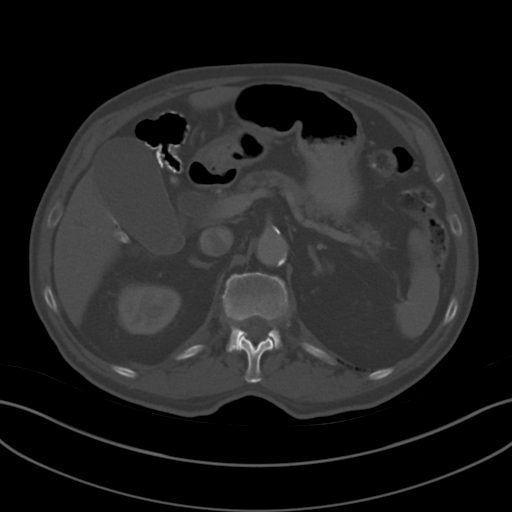
[im 79/101  soft-tissue]
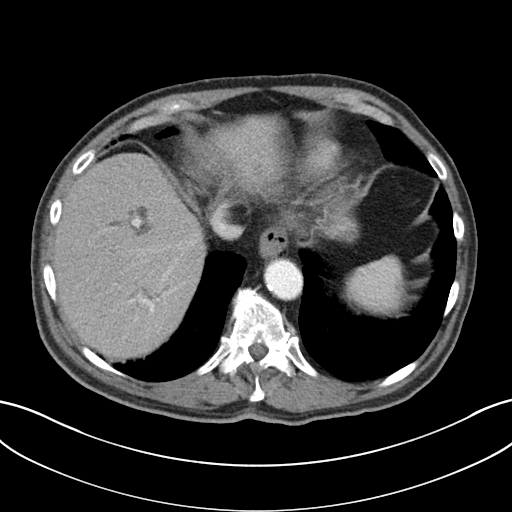
[im 85/101  soft-tissue]
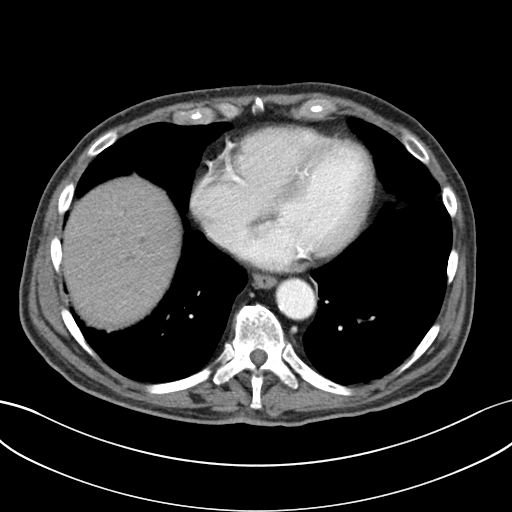
[im 95/101  soft-tissue]
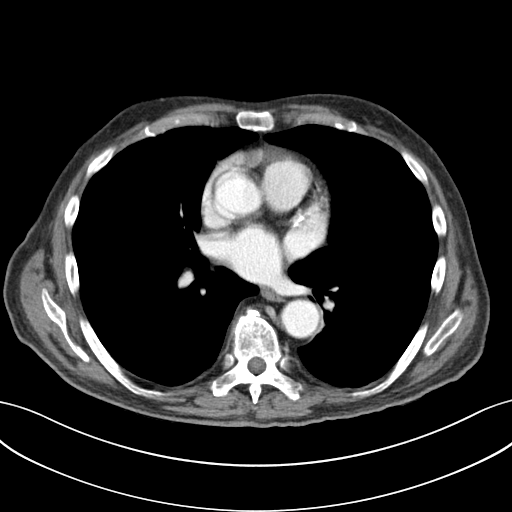

[Series 5: coronals · coronal · 0.72mm/px · 3 of 120 slices shown]
[im 40/120  soft-tissue]
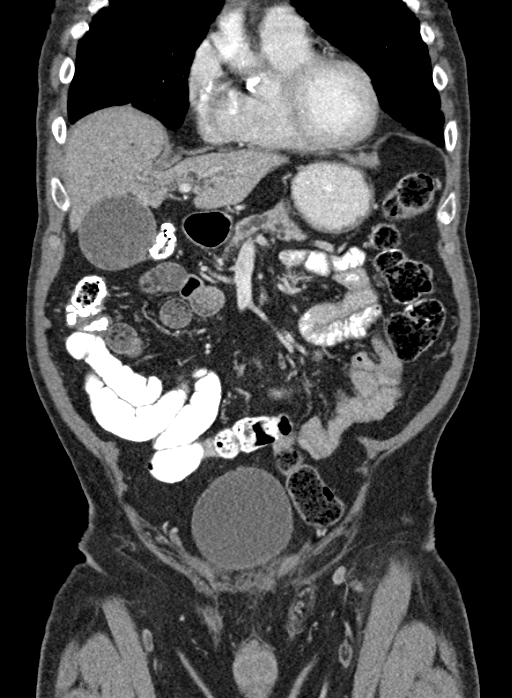
[im 53/120  soft-tissue]
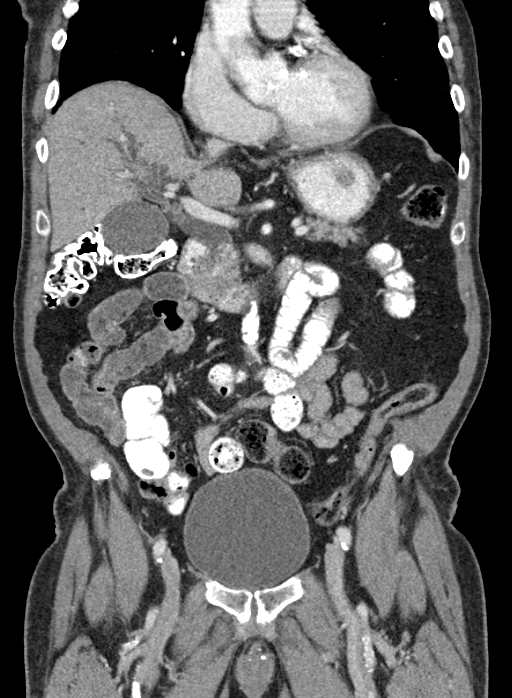
[im 67/120  soft-tissue]
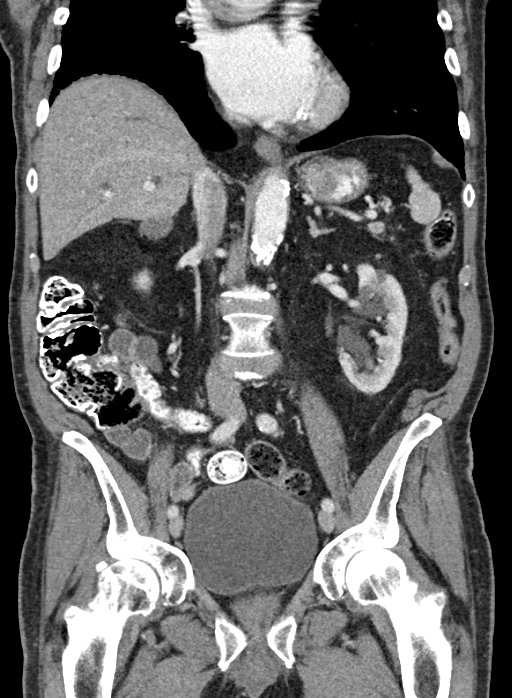

[15 of 46 positions shown; findings below may reference images not displayed]

FINDINGS: Lung bases are clear.  No pericardial fluid.

There is intrahepatic and extrahepatic biliary duct dilatation. The
common bile duct measures 16 mm up to the pancreatic head. There is
a abrupt transition at the pancreatic head. There is mild dilatation
of the pancreatic duct to 5 mm. There is a potential relative hypo
attenuating lesion within the head of the pancreas measuring 13 by
12 mm (image 40, series 2). The gallbladder is distended up to
cm. No inflammation evident. No focal hepatic lesion. The pancreas
itself is atrophic.

The spleen, adrenal glands, kidneys demonstrate no acute findings.
There parapelvic cysts in the kidneys.

The stomach contains a rounded masslike lesion in the fundus
measuring 4.0 x 3.2 cm (image 26, series 2). This appears to emanate
from the gastric mucosa wiht central calcifications. The more distal
stomach is normal. The small bowel colon are unremarkable.

Abdominal aorta is normal caliber. No retroperitoneal periportal
lymphadenopathy.

No free fluid the pelvis. The prostate gland is enlarged. Bladder is
normal. No pelvic lymphadenopathy. Insert negative then
IMPRESSION: 1. Intrahepatic and extrahepatic biliary duct dilatation with marked
dilatation of the common bile duct up to the pancreatic head.
Potential mass lesion within the pancreatic head. Recommend a
ERCP/EUS for relief of the biliary obstruction and sampling of the
potential pancreatic head lesion.
2. Mild dilatation of the pancreatic duct and associated pancreatic
atrophy would also be consistent with a pancreatic head mass lesion.
Recommend correlation with tumor marker CA 19 9.
3. There is a is potential polypoid 3 cm lesion extending from the
gastric mucosa of the stomach fundus. This could represent a primary
gastric neoplasm. Recommend optical evaluation at the same time as
the above recommended ERCP/EUS.
4. Prostate hypertrophy.

## 2015-12-01 IMAGING — RF DG ERCP WO/W SPHINCTEROTOMY
1 series · 2 of 2 positions shown · non-contrast
Comparison: CT, 11/24/2013.

CLINICAL DATA: ERCP for pancreatic mass.

EXAM:
ERCP
TECHNIQUE: Multiple spot images obtained with the fluoroscopic device and
submitted for interpretation post-procedure.

[Series 1: run · 2 of 2 slices shown]
[im 1/2]
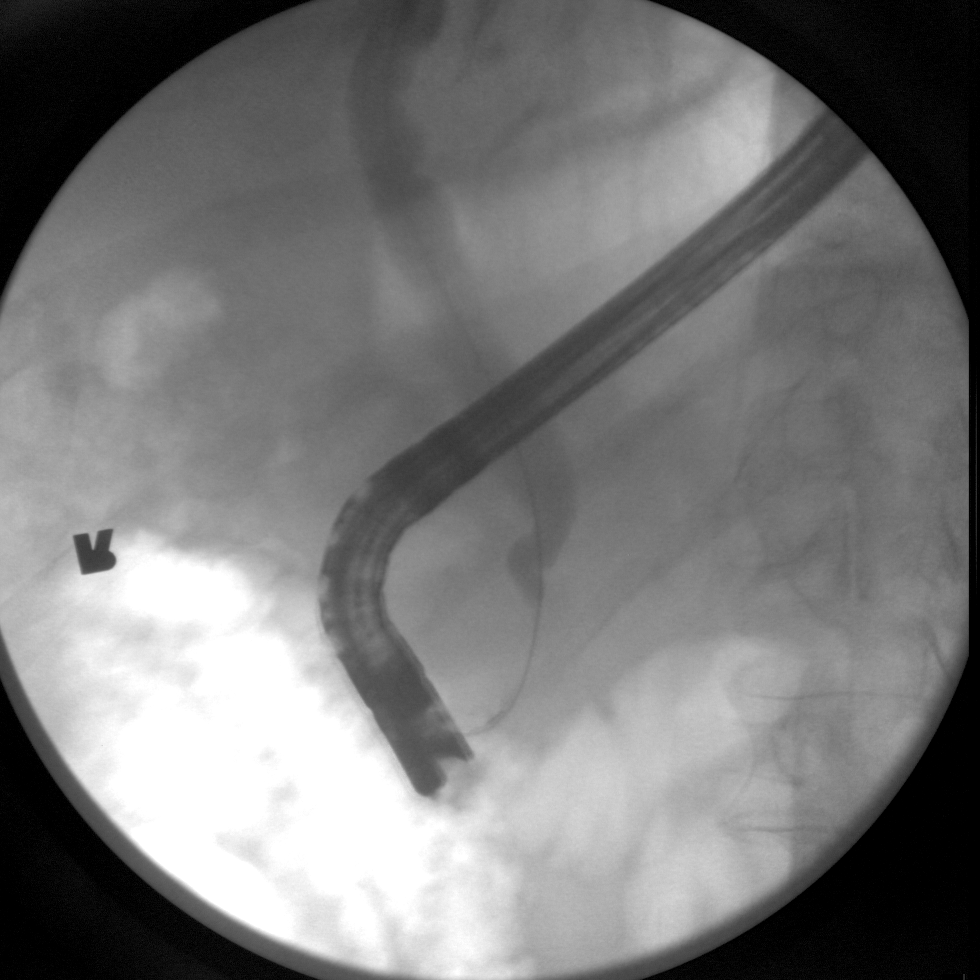
[im 2/2]
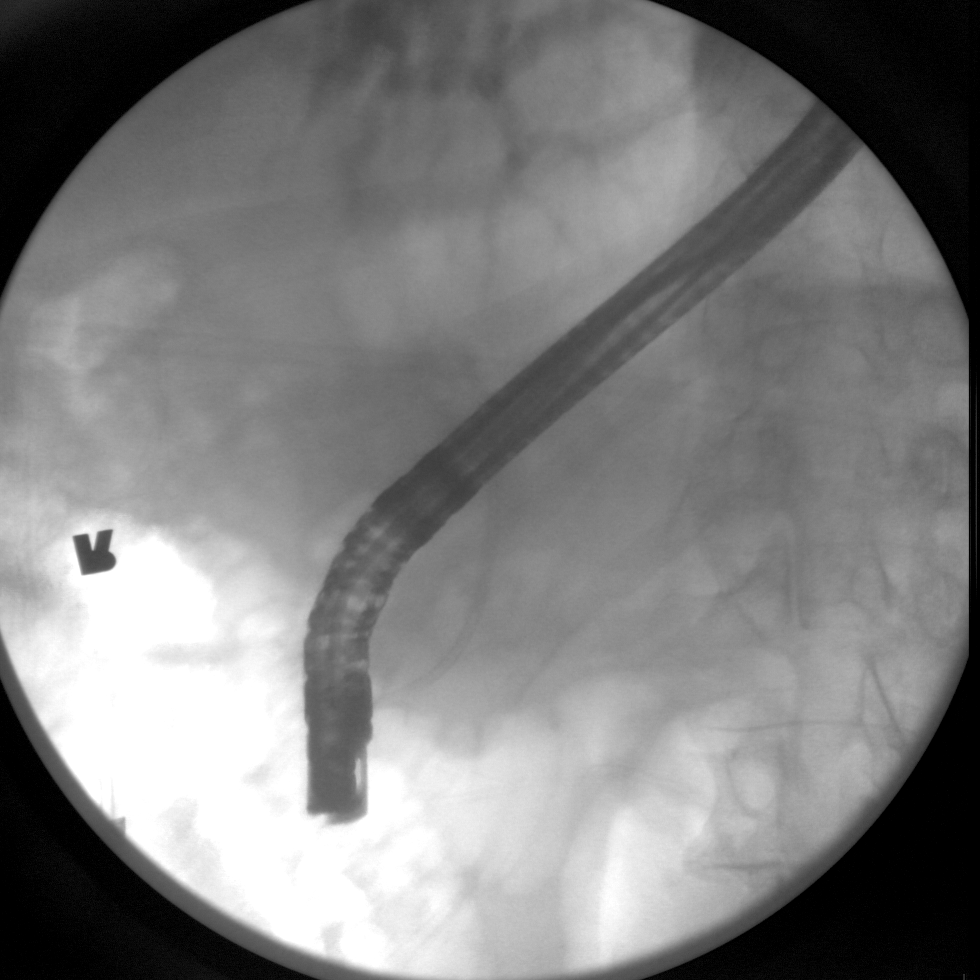

[2 of 2 positions shown; findings below may reference images not displayed]

FINDINGS: Two images from an ERCP show cannulization of the common bile duct
with subsequent placement of a biliary stent. The proximal duct
appears irregular and narrowed consistent with a stricture. The
distal duct is dilated. No duct filling defect is seen on the images
submitted.
IMPRESSION: ERCP images as described.

These images were submitted for radiologic interpretation only.
Please see the procedural report for the amount of contrast and the
fluoroscopy time utilized.
